# Patient Record
Sex: Male | Born: 1940 | Race: White | Hispanic: No | Marital: Married | Smoking: Current every day smoker
Health system: Southern US, Community
[De-identification: ages and names within clinical notes are randomized; demographics above are authoritative.]

## PROBLEM LIST (undated history)

## (undated) DIAGNOSIS — C439 Malignant melanoma of skin, unspecified: Secondary | ICD-10-CM

## (undated) DIAGNOSIS — I10 Essential (primary) hypertension: Secondary | ICD-10-CM

## (undated) DIAGNOSIS — M109 Gout, unspecified: Secondary | ICD-10-CM

## (undated) DIAGNOSIS — I509 Heart failure, unspecified: Secondary | ICD-10-CM

## (undated) HISTORY — DX: Gout, unspecified: M10.9

## (undated) HISTORY — PX: APPENDECTOMY: SHX54

## (undated) HISTORY — DX: Essential (primary) hypertension: I10

## (undated) HISTORY — DX: Heart failure, unspecified: I50.9

## (undated) HISTORY — DX: Malignant melanoma of skin, unspecified: C43.9

## (undated) HISTORY — PX: PARTIAL HIP ARTHROPLASTY: SHX733

---

## 2007-11-04 ENCOUNTER — Emergency Department: Payer: Self-pay | Admitting: Emergency Medicine

## 2008-12-11 ENCOUNTER — Ambulatory Visit: Payer: Self-pay | Admitting: General Surgery

## 2008-12-11 ENCOUNTER — Ambulatory Visit: Payer: Self-pay | Admitting: Cardiovascular Disease

## 2008-12-14 ENCOUNTER — Ambulatory Visit: Payer: Self-pay | Admitting: General Surgery

## 2009-01-15 ENCOUNTER — Emergency Department: Payer: Self-pay | Admitting: Emergency Medicine

## 2009-02-01 ENCOUNTER — Ambulatory Visit: Payer: Self-pay | Admitting: General Surgery

## 2009-11-29 ENCOUNTER — Emergency Department: Payer: Self-pay | Admitting: Emergency Medicine

## 2010-01-24 ENCOUNTER — Ambulatory Visit: Payer: Self-pay | Admitting: Surgery

## 2010-01-25 ENCOUNTER — Ambulatory Visit: Payer: Self-pay | Admitting: Surgery

## 2010-02-01 ENCOUNTER — Ambulatory Visit: Payer: Self-pay | Admitting: Surgery

## 2010-02-07 ENCOUNTER — Ambulatory Visit: Payer: Self-pay | Admitting: Surgery

## 2010-02-08 LAB — PATHOLOGY REPORT

## 2010-06-21 ENCOUNTER — Ambulatory Visit: Payer: Self-pay | Admitting: Cardiology

## 2013-02-11 ENCOUNTER — Ambulatory Visit: Payer: Self-pay | Admitting: Psychiatry

## 2013-02-24 ENCOUNTER — Ambulatory Visit: Payer: Self-pay | Admitting: Psychiatry

## 2014-01-30 ENCOUNTER — Ambulatory Visit: Payer: Self-pay | Admitting: Oncology

## 2014-01-30 LAB — CBC CANCER CENTER
BASOS PCT: 0.9 %
Basophil #: 0.1 x10 3/mm (ref 0.0–0.1)
EOS PCT: 3.2 %
Eosinophil #: 0.2 x10 3/mm (ref 0.0–0.7)
HCT: 28.9 % — ABNORMAL LOW (ref 40.0–52.0)
HGB: 9 g/dL — ABNORMAL LOW (ref 13.0–18.0)
LYMPHS ABS: 1.3 x10 3/mm (ref 1.0–3.6)
Lymphocyte %: 18.6 %
MCH: 27.7 pg (ref 26.0–34.0)
MCHC: 31.2 g/dL — ABNORMAL LOW (ref 32.0–36.0)
MCV: 89 fL (ref 80–100)
Monocyte #: 0.7 x10 3/mm (ref 0.2–1.0)
Monocyte %: 10.2 %
NEUTROS PCT: 67.1 %
Neutrophil #: 4.8 x10 3/mm (ref 1.4–6.5)
Platelet: 216 x10 3/mm (ref 150–440)
RBC: 3.25 10*6/uL — ABNORMAL LOW (ref 4.40–5.90)
RDW: 17.9 % — ABNORMAL HIGH (ref 11.5–14.5)
WBC: 7.1 x10 3/mm (ref 3.8–10.6)

## 2014-01-30 LAB — COMPREHENSIVE METABOLIC PANEL
ALT: 9 U/L — AB
ANION GAP: 7 (ref 7–16)
AST: 7 U/L — AB (ref 15–37)
Albumin: 2.6 g/dL — ABNORMAL LOW (ref 3.4–5.0)
Alkaline Phosphatase: 93 U/L
BUN: 14 mg/dL (ref 7–18)
Bilirubin,Total: 0.6 mg/dL (ref 0.2–1.0)
Calcium, Total: 8.2 mg/dL — ABNORMAL LOW (ref 8.5–10.1)
Chloride: 106 mmol/L (ref 98–107)
Co2: 30 mmol/L (ref 21–32)
Creatinine: 1.34 mg/dL — ABNORMAL HIGH (ref 0.60–1.30)
EGFR (Non-African Amer.): 56 — ABNORMAL LOW
Glucose: 100 mg/dL — ABNORMAL HIGH (ref 65–99)
OSMOLALITY: 286 (ref 275–301)
POTASSIUM: 3.9 mmol/L (ref 3.5–5.1)
Sodium: 143 mmol/L (ref 136–145)
TOTAL PROTEIN: 6.3 g/dL — AB (ref 6.4–8.2)

## 2014-01-30 LAB — TSH: THYROID STIMULATING HORM: 2.83 u[IU]/mL

## 2014-01-30 LAB — PROTIME-INR
INR: 1.2
Prothrombin Time: 15.3 secs — ABNORMAL HIGH (ref 11.5–14.7)

## 2014-01-30 LAB — IRON AND TIBC
IRON BIND. CAP.(TOTAL): 366 ug/dL (ref 250–450)
IRON: 24 ug/dL — AB (ref 65–175)
Iron Saturation: 7 %
Unbound Iron-Bind.Cap.: 342 ug/dL

## 2014-01-30 LAB — PRO B NATRIURETIC PEPTIDE: B-TYPE NATIURETIC PEPTID: 1654 pg/mL — AB (ref 0–125)

## 2014-01-30 LAB — FERRITIN: Ferritin (ARMC): 17 ng/mL (ref 8–388)

## 2014-01-30 LAB — URIC ACID: URIC ACID: 5.1 mg/dL (ref 3.5–7.2)

## 2014-01-30 LAB — HEMOGLOBIN A1C: HEMOGLOBIN A1C: 4.9 % (ref 4.2–6.3)

## 2014-01-30 LAB — LACTATE DEHYDROGENASE: LDH: 161 U/L (ref 85–241)

## 2014-01-30 LAB — APTT: ACTIVATED PTT: 33.7 s (ref 23.6–35.9)

## 2014-02-10 ENCOUNTER — Ambulatory Visit: Payer: Self-pay | Admitting: Oncology

## 2014-02-12 LAB — COMPREHENSIVE METABOLIC PANEL
ALT: 10 U/L — AB
Albumin: 3.2 g/dL — ABNORMAL LOW (ref 3.4–5.0)
Alkaline Phosphatase: 107 U/L
Anion Gap: 3 — ABNORMAL LOW (ref 7–16)
BILIRUBIN TOTAL: 0.5 mg/dL (ref 0.2–1.0)
BUN: 22 mg/dL — AB (ref 7–18)
CALCIUM: 8.5 mg/dL (ref 8.5–10.1)
CHLORIDE: 106 mmol/L (ref 98–107)
CO2: 31 mmol/L (ref 21–32)
Creatinine: 1.43 mg/dL — ABNORMAL HIGH (ref 0.60–1.30)
GFR CALC NON AF AMER: 52 — AB
GLUCOSE: 107 mg/dL — AB (ref 65–99)
Osmolality: 283 (ref 275–301)
Potassium: 3.6 mmol/L (ref 3.5–5.1)
SGOT(AST): 11 U/L — ABNORMAL LOW (ref 15–37)
Sodium: 140 mmol/L (ref 136–145)
TOTAL PROTEIN: 7.2 g/dL (ref 6.4–8.2)

## 2014-02-12 LAB — CBC CANCER CENTER
BASOS ABS: 0.1 x10 3/mm (ref 0.0–0.1)
Basophil %: 0.6 %
EOS PCT: 3.5 %
Eosinophil #: 0.3 x10 3/mm (ref 0.0–0.7)
HCT: 35.2 % — ABNORMAL LOW (ref 40.0–52.0)
HGB: 11.4 g/dL — AB (ref 13.0–18.0)
LYMPHS ABS: 2.1 x10 3/mm (ref 1.0–3.6)
Lymphocyte %: 21.6 %
MCH: 27.7 pg (ref 26.0–34.0)
MCHC: 32.4 g/dL (ref 32.0–36.0)
MCV: 86 fL (ref 80–100)
Monocyte #: 1 x10 3/mm (ref 0.2–1.0)
Monocyte %: 10 %
Neutrophil #: 6.2 x10 3/mm (ref 1.4–6.5)
Neutrophil %: 64.3 %
Platelet: 271 x10 3/mm (ref 150–440)
RBC: 4.11 10*6/uL — ABNORMAL LOW (ref 4.40–5.90)
RDW: 21.2 % — ABNORMAL HIGH (ref 11.5–14.5)
WBC: 9.7 x10 3/mm (ref 3.8–10.6)

## 2014-02-12 LAB — APTT: Activated PTT: 32.4 secs (ref 23.6–35.9)

## 2014-02-12 LAB — PROTIME-INR
INR: 1.1
PROTHROMBIN TIME: 14.2 s (ref 11.5–14.7)

## 2014-02-17 ENCOUNTER — Ambulatory Visit: Payer: Self-pay | Admitting: Cardiothoracic Surgery

## 2014-02-18 LAB — COMPREHENSIVE METABOLIC PANEL
ALT: 10 U/L — AB
Albumin: 3 g/dL — ABNORMAL LOW (ref 3.4–5.0)
Alkaline Phosphatase: 92 U/L
Anion Gap: 7 (ref 7–16)
BUN: 22 mg/dL — AB (ref 7–18)
Bilirubin,Total: 0.6 mg/dL (ref 0.2–1.0)
CREATININE: 1.09 mg/dL (ref 0.60–1.30)
Calcium, Total: 8.9 mg/dL (ref 8.5–10.1)
Chloride: 104 mmol/L (ref 98–107)
Co2: 31 mmol/L (ref 21–32)
EGFR (Non-African Amer.): >60
GLUCOSE: 106 mg/dL — AB (ref 65–99)
Osmolality: 287 (ref 275–301)
Potassium: 3.9 mmol/L (ref 3.5–5.1)
SGOT(AST): 10 U/L — ABNORMAL LOW (ref 15–37)
SODIUM: 142 mmol/L (ref 136–145)
Total Protein: 6.6 g/dL (ref 6.4–8.2)

## 2014-02-18 LAB — CBC CANCER CENTER
Basophil #: 0.1 x10 3/mm (ref 0.0–0.1)
Basophil %: 1.3 %
Eosinophil #: 0.1 x10 3/mm (ref 0.0–0.7)
Eosinophil %: 1.7 %
HCT: 34.1 % — ABNORMAL LOW (ref 40.0–52.0)
HGB: 10.7 g/dL — ABNORMAL LOW (ref 13.0–18.0)
Lymphocyte #: 1.7 x10 3/mm (ref 1.0–3.6)
Lymphocyte %: 22.1 %
MCH: 26.9 pg (ref 26.0–34.0)
MCHC: 31.4 g/dL — ABNORMAL LOW (ref 32.0–36.0)
MCV: 86 fL (ref 80–100)
Monocyte #: 0.7 x10 3/mm (ref 0.2–1.0)
Monocyte %: 9.1 %
Neutrophil #: 5.1 x10 3/mm (ref 1.4–6.5)
Neutrophil %: 65.8 %
Platelet: 210 x10 3/mm (ref 150–440)
RBC: 3.99 10*6/uL — ABNORMAL LOW (ref 4.40–5.90)
RDW: 21.1 % — ABNORMAL HIGH (ref 11.5–14.5)
WBC: 7.7 x10 3/mm (ref 3.8–10.6)

## 2014-02-18 LAB — PROTIME-INR
INR: 1.2
Prothrombin Time: 14.7 secs (ref 11.5–14.7)

## 2014-02-18 LAB — APTT: Activated PTT: 34.6 secs (ref 23.6–35.9)

## 2014-02-24 ENCOUNTER — Ambulatory Visit: Payer: Self-pay | Admitting: Oncology

## 2014-03-02 LAB — CBC CANCER CENTER
Basophil #: 0.1 x10 3/mm (ref 0.0–0.1)
Basophil %: 0.9 %
EOS PCT: 2.9 %
Eosinophil #: 0.2 x10 3/mm (ref 0.0–0.7)
HCT: 39 % — ABNORMAL LOW (ref 40.0–52.0)
HGB: 12.5 g/dL — AB (ref 13.0–18.0)
LYMPHS ABS: 1.7 x10 3/mm (ref 1.0–3.6)
Lymphocyte %: 23.2 %
MCH: 26.4 pg (ref 26.0–34.0)
MCHC: 31.9 g/dL — ABNORMAL LOW (ref 32.0–36.0)
MCV: 83 fL (ref 80–100)
MONO ABS: 0.6 x10 3/mm (ref 0.2–1.0)
Monocyte %: 8.4 %
NEUTROS PCT: 64.6 %
Neutrophil #: 4.8 x10 3/mm (ref 1.4–6.5)
PLATELETS: 251 x10 3/mm (ref 150–440)
RBC: 4.72 10*6/uL (ref 4.40–5.90)
RDW: 20.5 % — AB (ref 11.5–14.5)
WBC: 7.4 x10 3/mm (ref 3.8–10.6)

## 2014-03-02 LAB — COMPREHENSIVE METABOLIC PANEL
ALBUMIN: 3.3 g/dL — AB (ref 3.4–5.0)
ALT: 9 U/L — AB
ANION GAP: 8 (ref 7–16)
AST: 13 U/L — AB (ref 15–37)
Alkaline Phosphatase: 91 U/L
BILIRUBIN TOTAL: 0.8 mg/dL (ref 0.2–1.0)
BUN: 22 mg/dL — ABNORMAL HIGH (ref 7–18)
CALCIUM: 10.3 mg/dL — AB (ref 8.5–10.1)
CHLORIDE: 102 mmol/L (ref 98–107)
CREATININE: 1.24 mg/dL (ref 0.60–1.30)
Co2: 29 mmol/L (ref 21–32)
EGFR (African American): >60
GLUCOSE: 99 mg/dL (ref 65–99)
Osmolality: 281 (ref 275–301)
Potassium: 4.3 mmol/L (ref 3.5–5.1)
Sodium: 139 mmol/L (ref 136–145)
Total Protein: 7.4 g/dL (ref 6.4–8.2)

## 2014-03-02 LAB — TSH: THYROID STIMULATING HORM: 2.43 u[IU]/mL

## 2014-03-02 LAB — LACTATE DEHYDROGENASE: LDH: 105 U/L (ref 85–241)

## 2014-03-02 LAB — MAGNESIUM: MAGNESIUM: 1.5 mg/dL — AB

## 2014-03-02 LAB — T4, FREE: Free Thyroxine: 1.15 ng/dL (ref 0.76–1.46)

## 2014-03-02 LAB — PHOSPHORUS: Phosphorus: 4.3 mg/dL (ref 2.5–4.9)

## 2014-03-06 LAB — COMPREHENSIVE METABOLIC PANEL
ALBUMIN: 3 g/dL — AB (ref 3.4–5.0)
Alkaline Phosphatase: 92 U/L
Anion Gap: 11 (ref 7–16)
BILIRUBIN TOTAL: 0.7 mg/dL (ref 0.2–1.0)
BUN: 21 mg/dL — ABNORMAL HIGH (ref 7–18)
CHLORIDE: 106 mmol/L (ref 98–107)
CO2: 24 mmol/L (ref 21–32)
Calcium, Total: 7.6 mg/dL — ABNORMAL LOW (ref 8.5–10.1)
Creatinine: 1.29 mg/dL (ref 0.60–1.30)
EGFR (Non-African Amer.): 58 — ABNORMAL LOW
GLUCOSE: 115 mg/dL — AB (ref 65–99)
OSMOLALITY: 285 (ref 275–301)
POTASSIUM: 4 mmol/L (ref 3.5–5.1)
SGOT(AST): 14 U/L — ABNORMAL LOW (ref 15–37)
SGPT (ALT): 14 U/L
Sodium: 141 mmol/L (ref 136–145)
Total Protein: 6.9 g/dL (ref 6.4–8.2)

## 2014-03-06 LAB — CBC CANCER CENTER
BASOS ABS: 0 x10 3/mm (ref 0.0–0.1)
Basophil %: 0.8 %
Eosinophil #: 0.1 x10 3/mm (ref 0.0–0.7)
Eosinophil %: 2.6 %
HCT: 38.9 % — AB (ref 40.0–52.0)
HGB: 12 g/dL — AB (ref 13.0–18.0)
LYMPHS ABS: 0.4 x10 3/mm — AB (ref 1.0–3.6)
LYMPHS PCT: 17.3 %
MCH: 25.9 pg — AB (ref 26.0–34.0)
MCHC: 31 g/dL — AB (ref 32.0–36.0)
MCV: 84 fL (ref 80–100)
MONO ABS: 0.6 x10 3/mm (ref 0.2–1.0)
Monocyte %: 23.8 %
NEUTROS ABS: 1.4 x10 3/mm (ref 1.4–6.5)
Neutrophil %: 55.5 %
Platelet: 122 x10 3/mm — ABNORMAL LOW (ref 150–440)
RBC: 4.66 10*6/uL (ref 4.40–5.90)
RDW: 20.5 % — ABNORMAL HIGH (ref 11.5–14.5)
WBC: 2.4 x10 3/mm — ABNORMAL LOW (ref 3.8–10.6)

## 2014-03-09 LAB — CBC CANCER CENTER
BASOS ABS: 0.1 x10 3/mm (ref 0.0–0.1)
Basophil %: 3.2 %
Eosinophil #: 0.2 x10 3/mm (ref 0.0–0.7)
Eosinophil %: 4.5 %
HCT: 38.7 % — ABNORMAL LOW (ref 40.0–52.0)
HGB: 12.1 g/dL — ABNORMAL LOW (ref 13.0–18.0)
LYMPHS ABS: 1.2 x10 3/mm (ref 1.0–3.6)
Lymphocyte %: 33.1 %
MCH: 25.9 pg — ABNORMAL LOW (ref 26.0–34.0)
MCHC: 31.1 g/dL — ABNORMAL LOW (ref 32.0–36.0)
MCV: 83 fL (ref 80–100)
Monocyte #: 0.6 x10 3/mm (ref 0.2–1.0)
Monocyte %: 15.5 %
Neutrophil #: 1.6 x10 3/mm (ref 1.4–6.5)
Neutrophil %: 43.7 %
Platelet: 82 x10 3/mm — ABNORMAL LOW (ref 150–440)
RBC: 4.65 10*6/uL (ref 4.40–5.90)
RDW: 19.8 % — AB (ref 11.5–14.5)
WBC: 3.6 x10 3/mm — ABNORMAL LOW (ref 3.8–10.6)

## 2014-03-09 LAB — COMPREHENSIVE METABOLIC PANEL
ANION GAP: 6 — AB (ref 7–16)
AST: 15 U/L (ref 15–37)
Albumin: 3 g/dL — ABNORMAL LOW (ref 3.4–5.0)
Alkaline Phosphatase: 102 U/L
BILIRUBIN TOTAL: 0.9 mg/dL (ref 0.2–1.0)
BUN: 19 mg/dL — ABNORMAL HIGH (ref 7–18)
CALCIUM: 8.1 mg/dL — AB (ref 8.5–10.1)
Chloride: 107 mmol/L (ref 98–107)
Co2: 24 mmol/L (ref 21–32)
Creatinine: 1.07 mg/dL (ref 0.60–1.30)
EGFR (Non-African Amer.): >60
Glucose: 117 mg/dL — ABNORMAL HIGH (ref 65–99)
Osmolality: 277 (ref 275–301)
POTASSIUM: 3.9 mmol/L (ref 3.5–5.1)
SGPT (ALT): 15 U/L
Sodium: 137 mmol/L (ref 136–145)
Total Protein: 6.7 g/dL (ref 6.4–8.2)

## 2014-03-23 LAB — CBC CANCER CENTER
Basophil #: 0.1 x10 3/mm (ref 0.0–0.1)
Basophil %: 1.3 %
Eosinophil #: 0.4 x10 3/mm (ref 0.0–0.7)
Eosinophil %: 6.7 %
HCT: 39.7 % — AB (ref 40.0–52.0)
HGB: 12.5 g/dL — ABNORMAL LOW (ref 13.0–18.0)
LYMPHS ABS: 1.5 x10 3/mm (ref 1.0–3.6)
Lymphocyte %: 26.6 %
MCH: 25.9 pg — ABNORMAL LOW (ref 26.0–34.0)
MCHC: 31.5 g/dL — ABNORMAL LOW (ref 32.0–36.0)
MCV: 82 fL (ref 80–100)
MONOS PCT: 11.8 %
Monocyte #: 0.7 x10 3/mm (ref 0.2–1.0)
NEUTROS PCT: 53.6 %
Neutrophil #: 3.1 x10 3/mm (ref 1.4–6.5)
PLATELETS: 221 x10 3/mm (ref 150–440)
RBC: 4.84 10*6/uL (ref 4.40–5.90)
RDW: 21.7 % — AB (ref 11.5–14.5)
WBC: 5.8 x10 3/mm (ref 3.8–10.6)

## 2014-03-23 LAB — MAGNESIUM: MAGNESIUM: 1.6 mg/dL — AB

## 2014-03-23 LAB — COMPREHENSIVE METABOLIC PANEL
ANION GAP: 9 (ref 7–16)
AST: 10 U/L — AB (ref 15–37)
Albumin: 3.2 g/dL — ABNORMAL LOW (ref 3.4–5.0)
Alkaline Phosphatase: 89 U/L
BUN: 19 mg/dL — ABNORMAL HIGH (ref 7–18)
Bilirubin,Total: 0.5 mg/dL (ref 0.2–1.0)
CALCIUM: 8.2 mg/dL — AB (ref 8.5–10.1)
CO2: 27 mmol/L (ref 21–32)
Chloride: 107 mmol/L (ref 98–107)
Creatinine: 1.22 mg/dL (ref 0.60–1.30)
EGFR (African American): >60
EGFR (Non-African Amer.): >60
Glucose: 98 mg/dL (ref 65–99)
Osmolality: 287 (ref 275–301)
Potassium: 4.2 mmol/L (ref 3.5–5.1)
SGPT (ALT): 13 U/L — ABNORMAL LOW
Sodium: 143 mmol/L (ref 136–145)
Total Protein: 6.9 g/dL (ref 6.4–8.2)

## 2014-03-27 ENCOUNTER — Ambulatory Visit: Payer: Self-pay | Admitting: Oncology

## 2014-04-03 LAB — MAGNESIUM: Magnesium: 1.3 mg/dL — ABNORMAL LOW

## 2014-04-03 LAB — BASIC METABOLIC PANEL
ANION GAP: 10 (ref 7–16)
BUN: 16 mg/dL (ref 7–18)
CALCIUM: 7.9 mg/dL — AB (ref 8.5–10.1)
Chloride: 104 mmol/L (ref 98–107)
Co2: 28 mmol/L (ref 21–32)
Creatinine: 1.3 mg/dL (ref 0.60–1.30)
EGFR (African American): >60
GFR CALC NON AF AMER: 58 — AB
GLUCOSE: 109 mg/dL — AB (ref 65–99)
OSMOLALITY: 285 (ref 275–301)
Potassium: 3.7 mmol/L (ref 3.5–5.1)
Sodium: 142 mmol/L (ref 136–145)

## 2014-04-06 LAB — CLOSTRIDIUM DIFFICILE(ARMC)

## 2014-04-13 LAB — COMPREHENSIVE METABOLIC PANEL
ALBUMIN: 2.5 g/dL — AB (ref 3.4–5.0)
ALK PHOS: 68 U/L
ALT: 20 U/L
AST: 12 U/L — AB (ref 15–37)
Anion Gap: 9 (ref 7–16)
BILIRUBIN TOTAL: 0.5 mg/dL (ref 0.2–1.0)
BUN: 21 mg/dL — ABNORMAL HIGH (ref 7–18)
Calcium, Total: 9.7 mg/dL (ref 8.5–10.1)
Chloride: 103 mmol/L (ref 98–107)
Co2: 26 mmol/L (ref 21–32)
Creatinine: 1.25 mg/dL (ref 0.60–1.30)
EGFR (African American): >60
EGFR (Non-African Amer.): >60
GLUCOSE: 94 mg/dL (ref 65–99)
Osmolality: 278 (ref 275–301)
Potassium: 4.1 mmol/L (ref 3.5–5.1)
Sodium: 138 mmol/L (ref 136–145)
TOTAL PROTEIN: 6.3 g/dL — AB (ref 6.4–8.2)

## 2014-04-13 LAB — CBC CANCER CENTER
Bands: 5 %
Comment - H1-Com5: NORMAL
HCT: 43.2 % (ref 40.0–52.0)
HGB: 13.9 g/dL (ref 13.0–18.0)
Lymphocytes: 15 %
MCH: 25.5 pg — AB (ref 26.0–34.0)
MCHC: 32.3 g/dL (ref 32.0–36.0)
MCV: 79 fL — ABNORMAL LOW (ref 80–100)
Monocytes: 25 %
Platelet: 335 x10 3/mm (ref 150–440)
RBC: 5.47 10*6/uL (ref 4.40–5.90)
RDW: 21.3 % — AB (ref 11.5–14.5)
Segmented Neutrophils: 53 %
Variant Lymphocyte: 2 %
WBC: 11.5 x10 3/mm — ABNORMAL HIGH (ref 3.8–10.6)

## 2014-04-13 LAB — TSH: Thyroid Stimulating Horm: 1.59 u[IU]/mL

## 2014-04-13 LAB — T4, FREE: Free Thyroxine: 1.2 ng/dL (ref 0.76–1.46)

## 2014-04-13 LAB — MAGNESIUM: MAGNESIUM: 1.5 mg/dL — AB

## 2014-04-27 ENCOUNTER — Ambulatory Visit: Payer: Self-pay | Admitting: Oncology

## 2014-04-27 ENCOUNTER — Ambulatory Visit
Admit: 2014-04-27 | Disposition: A | Discharge status: Home / Self Care | Payer: Self-pay | Attending: Oncology | Admitting: Oncology

## 2014-05-04 LAB — COMPREHENSIVE METABOLIC PANEL
ALK PHOS: 65 U/L (ref 46–116)
ALT: 22 U/L (ref 14–63)
Albumin: 2.2 g/dL — ABNORMAL LOW (ref 3.4–5.0)
Anion Gap: 10 (ref 7–16)
BILIRUBIN TOTAL: 0.4 mg/dL (ref 0.2–1.0)
BUN: 19 mg/dL — AB (ref 7–18)
Calcium, Total: 8.3 mg/dL — ABNORMAL LOW (ref 8.5–10.1)
Chloride: 106 mmol/L (ref 98–107)
Co2: 25 mmol/L (ref 21–32)
Creatinine: 1.08 mg/dL (ref 0.60–1.30)
EGFR (African American): >60
EGFR (Non-African Amer.): >60
Glucose: 118 mg/dL — ABNORMAL HIGH (ref 65–99)
Osmolality: 285 (ref 275–301)
Potassium: 3.5 mmol/L (ref 3.5–5.1)
SGOT(AST): 9 U/L — ABNORMAL LOW (ref 15–37)
Sodium: 141 mmol/L (ref 136–145)
TOTAL PROTEIN: 5.5 g/dL — AB (ref 6.4–8.2)

## 2014-05-04 LAB — CBC CANCER CENTER
Basophil #: 0 x10 3/mm (ref 0.0–0.1)
Basophil %: 0.1 %
Eosinophil #: 0 x10 3/mm (ref 0.0–0.7)
Eosinophil %: 0.1 %
HCT: 41.1 % (ref 40.0–52.0)
HGB: 13.1 g/dL (ref 13.0–18.0)
LYMPHS ABS: 0.6 x10 3/mm — AB (ref 1.0–3.6)
Lymphocyte %: 11.2 %
MCH: 25.6 pg — ABNORMAL LOW (ref 26.0–34.0)
MCHC: 32 g/dL (ref 32.0–36.0)
MCV: 80 fL (ref 80–100)
MONOS PCT: 17 %
Monocyte #: 0.9 x10 3/mm (ref 0.2–1.0)
NEUTROS ABS: 3.7 x10 3/mm (ref 1.4–6.5)
Neutrophil %: 71.6 %
PLATELETS: 109 x10 3/mm — AB (ref 150–440)
RBC: 5.13 10*6/uL (ref 4.40–5.90)
RDW: 22.7 % — ABNORMAL HIGH (ref 11.5–14.5)
WBC: 5.2 x10 3/mm (ref 3.8–10.6)

## 2014-05-04 LAB — MAGNESIUM: MAGNESIUM: 1.8 mg/dL

## 2014-05-04 LAB — T4, FREE: Free Thyroxine: 1.03 ng/dL (ref 0.76–1.46)

## 2014-05-04 LAB — TSH: Thyroid Stimulating Horm: 0.92 u[IU]/mL

## 2014-05-06 ENCOUNTER — Emergency Department: Payer: Self-pay | Admitting: Emergency Medicine

## 2014-05-17 ENCOUNTER — Inpatient Hospital Stay: Payer: Self-pay | Admitting: Internal Medicine

## 2014-05-26 ENCOUNTER — Ambulatory Visit
Admit: 2014-05-26 | Disposition: A | Discharge status: Home / Self Care | Payer: Self-pay | Attending: Oncology | Admitting: Oncology

## 2014-05-31 ENCOUNTER — Inpatient Hospital Stay: Payer: Self-pay | Admitting: Internal Medicine

## 2014-06-03 ENCOUNTER — Ambulatory Visit
Admit: 2014-06-03 | Disposition: A | Discharge status: Home / Self Care | Payer: Self-pay | Attending: Oncology | Admitting: Oncology

## 2014-06-12 ENCOUNTER — Ambulatory Visit: Payer: Self-pay | Admitting: Family Medicine

## 2014-06-26 ENCOUNTER — Ambulatory Visit
Admit: 2014-06-26 | Disposition: A | Discharge status: Home / Self Care | Payer: Self-pay | Attending: Oncology | Admitting: Oncology
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
## 2014-07-20 LAB — CBC CANCER CENTER
BASOS ABS: 0.1 x10 3/mm (ref 0.0–0.1)
Basophil %: 1.1 %
EOS ABS: 0.2 x10 3/mm (ref 0.0–0.7)
Eosinophil %: 3 %
HCT: 39.2 % — ABNORMAL LOW (ref 40.0–52.0)
HGB: 13 g/dL (ref 13.0–18.0)
LYMPHS PCT: 33.4 %
Lymphocyte #: 1.7 x10 3/mm (ref 1.0–3.6)
MCH: 30.3 pg (ref 26.0–34.0)
MCHC: 33.2 g/dL (ref 32.0–36.0)
MCV: 92 fL (ref 80–100)
Monocyte #: 0.8 x10 3/mm (ref 0.2–1.0)
Monocyte %: 14.8 %
NEUTROS ABS: 2.5 x10 3/mm (ref 1.4–6.5)
Neutrophil %: 47.7 %
Platelet: 170 x10 3/mm (ref 150–440)
RBC: 4.29 10*6/uL — ABNORMAL LOW (ref 4.40–5.90)
RDW: 21.1 % — AB (ref 11.5–14.5)
WBC: 5.1 x10 3/mm (ref 3.8–10.6)

## 2014-07-20 LAB — COMPREHENSIVE METABOLIC PANEL
Albumin: 3.4 g/dL — ABNORMAL LOW
Alkaline Phosphatase: 62 U/L
Anion Gap: 7 (ref 7–16)
BILIRUBIN TOTAL: 1 mg/dL
BUN: 18 mg/dL
CALCIUM: 7.9 mg/dL — AB
CREATININE: 1.17 mg/dL
Chloride: 107 mmol/L
Co2: 28 mmol/L
EGFR (Non-African Amer.): >60
GLUCOSE: 105 mg/dL — AB
Potassium: 3.6 mmol/L
SGOT(AST): 14 U/L — ABNORMAL LOW
SGPT (ALT): 8 U/L — ABNORMAL LOW
SODIUM: 142 mmol/L
Total Protein: 6.5 g/dL

## 2014-07-20 LAB — TSH: Thyroid Stimulating Horm: 4.06 u[IU]/mL

## 2014-07-20 LAB — SURGICAL PATHOLOGY

## 2014-07-20 LAB — MAGNESIUM: MAGNESIUM: 1.1 mg/dL — AB

## 2014-07-26 NOTE — Consult Note (Signed)
PATIENT NAME:  Eddie Elliott, Eddie Elliott MR#:  [id number] DATE OF BIRTH:  1940/06/03  DATE OF CONSULTATION:  05/19/2014  REFERRING PHYSICIAN:   CONSULTING PHYSICIAN:  Lupita Dawn. Candace Cruise, MD  REASON FOR REFERRAL: Chronic diarrhea.   HISTORY OF PRESENT ILLNESS: The patient is a 74 year old white male who was diagnosed with metastatic melanoma to the spine. He ended up having radiation to his spine. He also received chemotherapy. His last chemotherapy was 5 days ago. He also has severe diarrhea, up to every 3 hours for the past 53 days. He was charting them all, up to 6 per day without adequate relief . He also tried prednisone taper with the highest dose being 100 mg daily by his oncologist. Even with this, he has had no relief of his diarrhea. Interestingly enough, even despite all of this diarrhea, he has no electrolyte imbalance or chronic diarrhea. He recalls having a normal colonoscopy 4-5 years ago in [place], where he lives most of the time. His other main complaint is just leg swelling. He normally takes Lasix at home, but he has been off of it. Fortunately, the stool test was negative for Clostridium difficile.   PAST MEDICAL HISTORY: He has metastatic melanoma with metastasis to the lungs and spine. He said he has had GI bleeding in the past from ulcer disease many years ago. Other history includes hypertension and gout.   PAST SURGICAL HISTORY: Includes replacement, excision of the melanoma.   FAMILY HISTORY: There is no family history.   REVIEW OF SYSTEMS: Have already been  reviewed on the admission. There are no changes.   ALLERGIES: He has no known drug allergies.   MEDICATIONS AT HOME: Include probiotic, prednisone taper, Lasix 20 mg daily, carvedilol 3 125 mg once a day, Lomotil  baby aspirin, hydrocodone, and extra other medicines.   PHYSICAL EXAMINATION:  GENERAL: The patient appears in no acute distress right now.  VITAL SIGNS: He is afebrile. Vital signs are stable. HEAD AND NECK: Within  normal limits.  CARDIAC: Regular rhythm and rate. LUNGS: Clear bilaterally.  ABDOMEN: Showed normoactive bowel sounds, soft. It is nontender. There is no hepatomegaly.  EXTREMITIES: Showed 3+ pitting edema bilaterally.  NEUROLOGIC: Nonfocal.  LABORATORY DATA: Despite chronic diarrhea, his electrolytes are completely normal. BNP is high at 4474, albumin is 1.1. The rest of the liver enzymes are normal. Albumin is low at 2.6. Troponin level is normal. White count is 15.0, hemoglobin 15.5. Blood cultures are negative so far. Stool tests are negative so far, including Clostridium difficile.   ASSESSMENT: This a patient with chronic diarrhea at least for the past 53 days. I suspect this is related to chemotherapy. It is unlikely to be infectious at this time, after so many days of diarrhea. Radiation directly to spine is unlikely to cause radiation enteritis.   RECOMMENDATIONS: I recommended to increase the Lomotil up to 8 tablets per day. Can continue the prednisone taper. If the Lomotil does not work, we can try paregoric to slow his bowel movements. We will also consider colonoscopy later as an outpatient to check for colitis, although I would expect some response to prednisone by now. Thank you for the referral. I will continue to follow the patient.   ____________________________ Lupita Dawn. Candace Cruise, MD pyo:mw D: 05/20/2014 11:21:30 ET T: 05/20/2014 12:05:43 ET JOB#: [id number]  cc: Lupita Dawn. Candace Cruise, MD, <Dictator> Lupita Dawn Cristobal Advani MD ELECTRONICALLY SIGNED 05/25/2014 7:54

## 2014-07-26 NOTE — Consult Note (Signed)
PATIENT NAME:  Eddie Elliott, Eddie Elliott MR#:  [id number] DATE OF BIRTH:  23-Jan-1941  DATE OF CONSULTATION:  05/18/2014  REFERRING PHYSICIAN:   CONSULTING PHYSICIAN:  Dwayne D. Callwood, MD  INDICATION: Left edema, diarrhea, atrial fibrillation, anasarca.  PRIMARY CARDIOLOGIST: Isaias Cowman, MD  PRIMARY CARE PHYSICIAN: Juluis Pitch, MD  ONCOLOGIST: Delight Hoh, MD  HISTORY OF PRESENT ILLNESS: The patient is a 74 year old white male with metastatic melanoma to the lung and spine, history of GI bleeding in the past. The patient complains of persistent GI diarrhea because of possible chemo. The patient has had diarrhea for which caused significant swelling and redness. The patient reports that since starting chemotherapy he has been struggling with multiple watery diarrhea, up to 6 a day. Stools often wake him up from sleep. The patient has not seen any blood or dark stools. Does not have any abdominal pain. Begun to notice swelling in his lower extremities. He has had rash, he thinks related to chemotherapy. He states his edematous legs are tender to touch. He has had pain deep in his ankles and his legs. With the persistent leg pain and swelling, he came to the Emergency Room for evaluation.   PAST MEDICAL HISTORY: Metastatic melanoma to the lungs and spine, GI bleeding, peptic ulcer disease, hypertension, atrial fibrillation, gout, leg edema, mild obesity.   SOCIAL HISTORY: Lives with his wife. Also lives in [place] part of the year. Former smoker. No alcohol consumption. No drug use.   PAST SURGICAL HISTORY: Right hip replacement, excision of melanoma. He has also had  EGD with cauterization for peptic ulcer disease.   FAMILY HISTORY: Essentially negative.   REVIEW OF SYSTEMS: Complains of fatigue, weakness, diarrhea, weight gain, leg swelling, mild shortness of breath, leg pain. Denies weight loss. No hemoptysis or hematemesis. No bright red blood per rectum. No vision change or  hearing change. Denies sputum production. Denies cough. No blackout spells or syncope. He has complained of generalized rash as well.   ALLERGIES: None.   MEDICATIONS: Tamsulosin 0.4 once a day, propafenone 30 mg he states once a day, prochlorperazine 10 mg every 6 hours as needed, probiotic formula once a day, prednisone taper, furosemide 20 mg a day, carvedilol 2.125 a day, atropine diphenoxylate 0.25/2.5 two tablets 3 times a day, aspirin 81 mg a day, Xanax 0.25 twice a day, Tylenol with Codeine 325/5 every 6 hours as needed.   PHYSICAL EXAMINATION: VITAL SIGNS: Blood pressure 110/70, pulse around 100 and irregular, respiratory rate 16, afebrile.  HEENT: Normocephalic, atraumatic. Pupils equal and reactive to light.  NECK: Supple. No significant JVD, bruits or adenopathy.  LUNGS: Clear to auscultation and percussion. No significant wheezing, rhonchi, or rale.  HEART: Irregular, irregular. Systolic ejection murmur at left sternal border. PMI nondisplaced.  ABDOMEN: Benign.  EXTREMITIES: Significant edema, 3 to 4+, with redness and tenderness. Adequate pulses.  NEUROLOGIC: Intact.  SKIN: Normal except for diffuse erythematous rash.   DIAGNOSTIC DATA: Sodium 140, potassium 4.4, chloride 104, bicarb 29, BUN 19, creatinine 0.98. BNP 4474. Glucose 176, calcium 7.5. LFTs normal. Troponin 0.02. White count 15, hemoglobin 15.5, platelet count 188,000. C. diff was negative.   CT of the abdomen and pelvis with contrast shows no acute findings. There is chronic metastatic disease in L5 vertebra.  Chest x-ray is negative.   ASSESSMENT:  1.  Diarrhea. 2.  Atrial fibrillation. 3.  Anasarca, leg swelling, edema, pain. 4.  Metastatic melanoma. 5.  Hypertension. 6.  Tachycardia. 7.  Obesity. 8.  History  of peptic ulcer disease.   PLAN:  1.  Agree with admit. Continue to control diarrhea. With negative Clostridium difficile, probably related to radiation and chemotherapy. Infectious process must  also still be considered. We will continue to Hemoccult stool. Agree with gastroenterology evaluation and work-up. Continue empiric antibiotic therapy for now. Consider scope and/or biopsy. Continue medications to help with symptoms.  2.  Atrial fibrillation. Continue ventricular fibrillation rate control. Do not recommend anticoagulation at this point. Continue Coreg therapy. Got diltiazem in the Emergency Room. Recommend echocardiogram. Continue rate control. Would probably hold off on anticoagulation for now. Continue antiarrhythmic control with propafenone. The dose should be increased, probably around 300 twice a day. 3.  Anasarca. Continue diuretic control. Would try to reduce volume overload. This may all be related to chemotherapy. Get 2-D echo for evaluation of systolic failure related to chemotherapy. Consider volume restriction and mild diuretic therapy.  4.  Pain control. Continue significant pain control for his legs and his back as well as metastatic disease as necessary.  5.  For swelling, ultrasound of his legs to rule out deep venous thrombosis and mild compression stockings.  6.  Deep vein thrombosis prophylaxis I think should be helpful with his increased risk because of his known cancer. Continue hypertension control. Continue gout management. 7.  For reflux and peptic ulcer disease, continue empiric therapy. No evidence of bleeding at this point. He is not on anticoagulation, except for possible deep vein thrombosis prophylaxis. Continue medical therapy. Agree with gastroenterology input. Continue to follow the patient.  ____________________________ Loran Senters. Clayborn Bigness, MD ddc:sb D: 05/19/2014 10:04:44 ET T: 05/19/2014 10:35:51 ET JOB#: [id number]  cc: Dwayne D. Clayborn Bigness, MD, <Dictator> Yolonda Kida MD ELECTRONICALLY SIGNED 06/14/2014 17:35

## 2014-07-26 NOTE — Consult Note (Signed)
Brief Consult Note: Comments: Attempted to see pt but pt was down for PET.  Discussed with Attending.  Will see pt later today.  Electronic Signatures: Andria Meuse (NP)  (Signed 07-Mar-16 10:28)  Authored: Brief Consult Note   Last Updated: 07-Mar-16 10:28 by Andria Meuse (NP)
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
## 2014-07-26 NOTE — Consult Note (Signed)
Present Illness The patient is a 74 year old male with a history of metastatic malignant melanoma and chronic diarrhea, history of cardiac atrial fibrillation on propafenone. The patient presents to the emergency room today with worsening diarrhea associated with weakness and fatigue. In the emergency room, the patient was noted to be short of breath and hypotensive.Laboratories revealed evidence of acute on chronic renal insufficiency.  He had had watery diarrhea for quite some time.  He was taking a diuretic at that time.  He was relatively tachycardic with his atrial fibrillation and hypotensive.  He was given IV fluids with improvement in his heart rate and blood pressure.  He currently is in atrial fibrillation with a ventricular response of approximately 90 beats per minute.  His blood pressure has improved.  His systolic blood pressures are in the low to mid 100 range.  He is ruled out for a myocardial infarction.  The his diarrhea improved somewhat today.  His serum potassium and serum magnesium are normal eyes.  He currently feels at his baseline.  He underwent a PET scan to evaluate possible progression of his disease.  Results and evaluation of this are still pending.  He has not been felt to be a candidate for chronic anticoagulation due to comorbid condition and bleeding risk.  He is currently on aspirin alone.  He remains on   Rythmol at 300 mg once daily and carvedilol for rate control.   Physical Exam:  GEN no acute distress   HEENT PERRL   NECK No masses   RESP clear BS   CARD Irregular rate and rhythm  No murmur   ABD positive tenderness   LYMPH negative neck   EXTR negative cyanosis/clubbing, negative edema   SKIN normal to palpation   NEURO cranial nerves intact, motor/sensory function intact   PSYCH A+O to time, place, person   Review of Systems:  Subjective/Chief Complaint mildly weak and fatigued   General: Fatigue  Weakness   Skin: No Complaints   ENT: No  Complaints   Eyes: No Complaints   Neck: No Complaints   Respiratory: Short of breath   Cardiovascular: No Complaints   Gastrointestinal: Diarrhea   Genitourinary: No Complaints   Vascular: No Complaints   Musculoskeletal: No Complaints   Neurologic: No Complaints   Hematologic: No Complaints   Endocrine: No Complaints   Psychiatric: No Complaints   Review of Systems: All other systems were reviewed and found to be negative   Medications/Allergies Reviewed Medications/Allergies reviewed   Family & Social History:  Family and Social History:  Family History Non-Contributory   Social History negative tobacco   EKG:  Interpretation atrial fibrillation with rapid  ventricular response initially the    No Known Allergies:    Impression 74 year old male with history of  chronic atrial fibrillation controlled with Rythmol at 300 mg daily and carvedilol.  Was admitted with profound diarrhea causing dehydration.  He was weak and fatigued.  He was noted to be volume depleted as evidenced by  acute on chronic renal insufficiency.  His heart rate improved as well as blood pressure with rehydration.  His diarrhea improved today.  He is hemodynamically stable at present with a ventricular response of approximately 90. He is not a candidate for chronic anticoagulation due to bleeding risk.  Would continue with Rythmol at 300 mg daily and carvedilol.  Patient is not appear to require any further cardiac workup during this hospitalization.  He is currently stable.  He had an appointment  with Dr. Clayborn Bigness tomorrow.  Will cancel this and make it 1 week later.   Plan 1. Continue with Rythmol at 300 mg daily and carvedilol 2. Continue with aspirin avoiding chronic anticoagulation 3. Continue hydration 4. Follow-up with Dr. Clayborn Bigness as an outpatient 5. No further cardiac workup indicated during this hospitalization.   Electronic Signatures: Teodoro Spray (MD)  (Signed 07-Mar-16  17:27)  Authored: General Aspect/Present Illness, History and Physical Exam, Review of System, Family & Social History, EKG , Allergies, Impression/Plan   Last Updated: 07-Mar-16 17:27 by Teodoro Spray (MD)

## 2014-07-26 NOTE — Consult Note (Signed)
Brief Consult Note: Diagnosis: chronic diarrhea.   Patient was seen by consultant.   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  Recent hospitalization with negative stools studies.  Pancreatic insufficiency, microscopic colitis, medication side effects, radiation proctitis & opportunisitc infections such as CMV remain in differential.  He had PET scan today & we discussed pursuing colonoscopy with biopsies pending PET findings.  He will think about this.  Plan: 1) Consider pancreatic enzyme supplementation 2) Continue supportive measures 3) Follow up PET scan Thanks for allowing [place] to participate in his care.  Please see full dictated note. #[id number].  Electronic Signatures: Andria Meuse (NP)  (Signed 08-Mar-16 00:03)  Authored: Brief Consult Note   Last Updated: 08-Mar-16 00:03 by Andria Meuse (NP)

## 2014-07-26 NOTE — Consult Note (Signed)
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 HPI:   Patient last received treatment with nivolumab on May 04, 2014. He continues to have persistent diarrhea despite treatment with prednisone. Patient states his diarrhea is only mildly improved. He presented with increased shortness of breath and was found to be in atrial fibrillation. He also has increased bilateral lower extremity edema that is painful to touch. He has no neurologic complaints. He denies any current chest pain, shortness of breath, cough, or hemoptysis. He denies any fevers or weight loss. He denies any nausea, vomiting, or constipation. He has no urinary complaints. Patient offers no further specific complaints today.   Review of Systems:  Performance Status (ECOG): 2  Pain ?: No complaints (0, none)  Emotional well-being: None  Review of Systems:   As per HPI. Otherwise, 10 point system review was negative.   Allergies:  No Known Allergies:   Smoking History: Smoking History 2 30 yr smoking history.  PFSH: Additional Past Medical and Surgical History: melanoma in situ of scalp, melanoma ??2 above left and right scapula, unclear depth her stage. hypertension, gout, appendectomy, right hip replacement.    Family history: Negative and noncontributory.    Social history: Former heavy tobacco use, none recently. Denies alcohol.   Home Medications: Medication Instructions Last Modified Date/Time  predniSONE 10 mg oral tablet 5 tab(s) orally 2 times a day x 4 days 4 tab(s) orally 2 times a day x 4 days 3 tab(s) orally 2 times a day x 4 days 2 tab(s) orally 2 times a day x 4 days 1 tab(s) orally 2 times a day x 4 days 21-Feb-16 09:27  Probiotic Formula - oral capsule 1 cap(s) orally once a day 21-Feb-16 09:27  tamsulosin 0.4 mg oral capsule 1 cap(s) orally once a day 30 minutes after same  meal. (Secotex) 21-Feb-16 09:27  propafenone 300 mg oral tablet 1 tab(s) orally once a day 21-Feb-16 09:27  Aspirin Enteric Coated 81 mg oral delayed release tablet 1 tab(s) orally once a day 21-Feb-16 09:27  atropine-diphenoxylate 0.025 mg-2.5 mg oral tablet 2 tab(s) orally 3 times a day 21-Feb-16 09:27  furosemide 20 mg oral tablet 1 tab(s) orally once a day as needed for swelling. 21-Feb-16 09:27  acetaminophen-HYDROcodone 325 mg-5 mg oral tablet 1 tab(s) orally every 6 hours as needed for pain. 21-Feb-16 09:27  ALPRAZolam 0.25 mg oral tablet 1 tab(s) orally 2 times a day as needed for anxiety/sleep. 21-Feb-16 09:27  prochlorperazine 10 mg oral tablet 1 tab(s) orally every 6 hours, As Needed - for Nausea, Vomiting 21-Feb-16 09:27  carvedilol 3.125 mg oral tablet 1 tab(s) orally once a day 21-Feb-16 09:27   Vital Signs:  :: vital signs stable, patient afebrile.   Physical Exam:  General: well developed, well nourished, and in no acute distress.  Mental Status: normal affect  Eyes: anicteric sclera  Respiratory: clear to auscultation bilaterally  Cardiovascular: regular rate and rhythm, no murmur, rub, or gallop  Gastrointestinal: soft, nondistended, nontender, no organomegaly.  normal active bowel sounds  Musculoskeletal: 2-3+ bilateral lower extremity edema.  Skin: No rash or petechiae noted  Neurological: alert, answering all questions appropriately.  Cranial nerves grossly intact   Laboratory Results: Routine Chem:  22-Feb-16 04:05   Glucose, Serum  131  BUN  20  Creatinine (comp) 1.06  Sodium, Serum 138  Potassium, Serum 4.0  Chloride, Serum 101  CO2, Serum  32  Calcium (Total), Serum  7.2  Anion Gap  5  Osmolality (calc) 280  eGFR (African American) >60  eGFR (Non-African American) >60 (eGFR values <40m/min/1.73 m2 may be an indication of chronic kidney disease (CKD). Calculated eGFR, using the MRDR Study equation, is useful in  patients with stable renal  function. The eGFR calculation will not be reliable in acutely ill patients when serum creatinine is changing rapidly. It is not useful in patients on dialysis. The eGFR calculation may not be applicable to patients at the low and high extremes of body sizes, pregnant women, and vegetarians.)  Routine Hem:  22-Feb-16 04:05   WBC (CBC) 8.0  RBC (CBC) 5.05  Hemoglobin (CBC) 13.0  Hematocrit (CBC) 40.8  Platelet Count (CBC)  134  MCV 81  MCH  25.8  MCHC  31.9  RDW  24.1  Neutrophil % 75.2  Lymphocyte % 9.7  Monocyte % 14.4  Eosinophil % 0.6  Basophil % 0.1  Neutrophil # 6.0  Lymphocyte #  0.8  Monocyte #  1.2  Eosinophil # 0.1  Basophil # 0.0 (Result(s) reported on 18 May 2014 at 05:05AM.)   Medical Imaging Results:   Review Medical Imaging   Portable Single View 17-May-2014 07:22:00: IMPRESSION:  No active disease.      Electronically Signed    By: TKerby MoorsM.D.    On: 05/17/2014 07:55         Verified By: TAngelita Ingles M.D., Abdomen and Pelvis With Contrast 17-May-2014 10:39:00: IMPRESSION:  1. No definite acute findings identified within the abdomen or  pelvis.  2. Mild prominence of the colonic wall with mucosal enhancement but  no significant colonic wall edema. No pneumatosis or evidence of  perforation identified.  3. Stable right adrenal gland myelolipoma  4. 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      Electronically Signed    By: TKerby MoorsM.D.    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 Plan:   1. Melanoma: Patient last received treatment with nivolumab 218mkg on May 04, 2014. Given his persistent diarrhea, we will consider discontinuing treatments. It is possible his diarrhea is immune mediated.Diarrhea: Possibly secondary to his treatments although his high-dose extended prednisone taper  should have helped resolve his symptoms. No evidence of infection. Appreciate GI input. Patient will likely need a colonoscopy in the near future.today. H/o GI bleed: Patient reports no further bleeding. [place] determine whether this was unusual site of metastatic disease.Atrial fibrillation: Cardiac echo results noted. Appreciate cardiology input.Peripheral edema: Likely multifactorial. Monitor. consult, will follow.  Advance Directive:  Advance Directive ([employer]   Advance Directive Information Given patient refused   Electronic Signatures: FiDelight HohMD)  (Signed [id number])  Authored: Note Type, CC/HPI, Review of Systems, ALLERGIES, Smoking Cessation, Patient Family Social History, HOME MEDICATIONS, Vital Signs, Physical Exam, Lab Results Review, Rad Results Review, Assessment and Plan, Advance Directive   Last Updated: 22-Feb-16 17:55 by FiDelight HohMD)

## 2014-07-26 NOTE — Consult Note (Signed)
Pt seen and examined. Full consult to follow. Pt with metastatic melanoma.S/P XRT to spine and chemotherapy. Last chemo 5 days ago. Severe diarrhea upto every 3 hrs for 53 days. Tried lomotil upto 6 per day. Tried prednisone upto 100mg  daily. No effects on diarreha. Interestingly, no evidence of electrolyte imbalance from chronic diarrhea. Had normal colon 4-5 yrs ago in [place]. C/O mainly of leg swelling. C.diff neg. Chronic diarrhea likely due to chemotherapy. Doubt focal radiation to spine would cause chronic diarrhea. Doubt infectious process. Can give lomotil upto 8 tabs per day. If lomotil does not work, then can try paregoric as well.  Give lasix to control edema. Would consider colonoscopy later as outpt to check for colitis, although should have responded to prednisone. thanks.   Electronic Signatures: Verdie Shire (MD) (Signed on 22-Feb-16 07:54)  Authored   Last Updated: 22-Feb-16 15:35 by Verdie Shire (MD)

## 2014-07-26 NOTE — Discharge Summary (Signed)
PATIENT NAME:  Eddie Elliott, Eddie Elliott MR#:  [id number] DATE OF BIRTH:  1940-10-07  DATE OF ADMISSION:  05/31/2014 DATE OF DISCHARGE:  06/01/2014  DISCHARGE DIAGNOSES:  1. Acute on chronic diarrhea.  2. Malignant melanoma.   CONSULTATIONS: Gastroenterology.  Cardiology, Javier Docker. Ubaldo Glassing, MD.  PROCEDURES: None.   DISCHARGE MEDICATIONS: Tamsulosin 0.4 mg 1 capsule p.o. 30 minutes after meals, propafenone 300 mg 1 tablet p.o. once daily, probiotic 1 capsule p.o. once daily, aspirin 81 mg once daily, alprazolam 0.25 mg 1 tablet p.o. 2 times a day as needed for anxiety, prochlorperazine 10 mg 1 tablet every 6 hours as needed for nausea and vomiting, acetaminophen with hydrocodone 325/5 one tablet p.o. every 6 hours for 5 days as needed for pain, furosemide 20 mg 1 tablet p.o. 2 times a day for 1 week and then take once daily; Klor-Con 10 mEq 1 tablet p.o. once daily with Lasix, Coreg 3.125 mg 1 tablet p.o. 2 times a day, atropine with diphenoxylate 0.025 mg/2.5 mg 1 tablet p.o. 4 times a day, Creon 24,000 units 1 capsule p.o. 3 times a day for 2 times with meals and once with snack as recommended by gastroenterology.   DIET: Regular diet. Regular consistency.  Follow up with primary care physician in 1 week, Dr. Candace Cruise from gastroenterology in 1 week, Dr. Grayland Ormond in 1 to 2 weeks as recommended, and cardiology, Dr. Ubaldo Glassing, in 2 to 4 weeks.   BRIEF HISTORY AND PHYSICAL AND HOSPITAL COURSE: The patient is a 74 year old male with a history of metastatic malignant melanoma and chronic diarrhea, who came into the ED with a chief complaint of worsening of his diarrhea and hypotension. The patient has been following up with Dr. Candace Cruise as an outpatient. Also has chronic history of atrial fibrillation and on propafenone. Please see the history and physical for details.  HOSPITAL COURSE BASED ON THE PROBLEM:  1. Acute on chronic diarrhea regarding which the patient was admitted to the hospitalist service. IV fluids were  provided. He was started on empiric p.o. vancomycin and IV steroids. Stool for Clostridium difficile toxin was done and turned out to be negative. Stool for occult blood was also negative. He was given Lomotil and, by the next day, his watery diarrhea was resolved and the patient started having semi-formed stool. He started feeling much better. He was evaluated by gastroenterology PA, Ms. Vickey Huger, and after discussing with Dr. Candace Cruise they have recommended the patient to take Creon with 3 meals and snacks. Also Lomotil was recommended to be continued., As the patient started feeling better, they have recommended to discharge the patient home from a GI standpoint and follow up with Dr. Candace Cruise as an outpatient. 2. Chronic atrial fibrillation. The patient was seen by Dr. Ubaldo Glassing. As the patient is not a good candidate for anticoagulation, Dr. Ubaldo Glassing has recommended the patient to continue his home medications. Rythmol 300 mg on a daily basis and Coreg. Also recommended to continue aspirin salicylate to avoid chronic anticoagulation. Recommended to follow up with Dr. Clayborn Bigness the next day.  3. Metastatic malignant melanoma. The patient is to follow up with his oncologist for continuation of treatment and surveillance as scheduled as an outpatient. PET scan was ordered, which did not reveal any acute changes.  4. Acute kidney injury, resolved with IV fluids. Dehydration is significantly improved.  5. Hypomagnesemia, repleted with magnesium supplements. Magnesium is 1.8 on March 7.   The patient was discharged home under satisfactory condition.   PHYSICAL EXAMINATION: VITAL  SIGNS: On March 7, temperature 97.6, pulse 95 to 101, respirations 18, blood pressure 103/66. Pulse oximetry 95 to 98% on room air.  GENERAL APPEARANCE: Not in acute distress. Moderately built and nourished.  HEENT: Normocephalic, atraumatic. Pupils are equally reacting to light and accommodation. No scleral icterus. No sinus tenderness. No  postnasal drip. Moist mucous membranes.  NECK: Supple. No JVD. No. No thyromegaly. Range of motion is intact. LUNGS: Clear to auscultation bilaterally. No accessory muscle use and no anterior chest wall tenderness on palpation.  CARDIAC: S1, S2 normal. Irregularly irregular.  GASTROINTESTINAL: Soft. Bowel sounds are positive in all 4 quadrants. Some discomfort is present but nontender. No mass. NEUROLOGIC: Awake, alert, oriented x3. Cranial nerves II through XII are grossly intact. Motor and sensory are intact. Reflexes are 2+.Marland Kitchen  EXTREMITIES: No edema. No cyanosis. No clubbing.  SKIN: Warm to touch. Normal turgor. No rashes.  PSYCHIATRIC: Normal mood and affect.  LABORATORY AND IMAGING STUDIES: PET scan for melanoma restaging performed on March 7 revealed interval response to therapy. Large prevascular mass within the chest has decreased in size and degree of FDG uptake. Additionally, there has been interval decrease in size in FDG uptake associated with the left upper lobe pulmonary nodule. No new or progressive disease is identified. Interval improvement in abnormal uptake associated with a destructive lesion involving the L5 vertebra. The patient's BMP, glucose 160. BUN and creatinine are back to normal. Sodium and potassium are normal. Chloride 117, CO2 of 19, GFR greater than 60. Anion gap 7. Serum osmolality 290. Calcium 7.6, magnesium 1.8. LFTs: Total protein 5.5, albumin 2.1, bilirubin total 0.5, alkaline phosphatase 51, AST 14 ALT 9. CBC is normal. Stool for C. difficile toxin negative. No Salmonella. No Campylobacter. Fecal occult blood test was negative. Urinalysis: Nitrites and leukocyte esterase are negative. Blood is negative. Glucose and bilirubin are negative.  Diagnosis and plan of care were discussed in detail with the patient. He verbalized understanding of the plan. All questions were answered.  TOTAL TIME SPENT ON THE DISCHARGE: 45 minutes.   ____________________________ Nicholes Mango, MD ag:jh D: 06/05/2014 19:37:24 ET T: 06/06/2014 07:26:05 ET JOB#: [id number]  cc: Nicholes Mango, MD, <Dictator> Youlanda Roys. Lovie Macadamia, MD Lupita Dawn. Candace Cruise, MD Javier Docker Ubaldo Glassing, MD Kathlene November. Grayland Ormond, MD  Nicholes Mango MD ELECTRONICALLY SIGNED 06/15/2014 22:44

## 2014-07-26 NOTE — Discharge Summary (Signed)
PATIENT NAME:  Eddie Elliott, Eddie Elliott MR#:  [id number] DATE OF BIRTH:  04/20/1940  DATE OF ADMISSION:  05/17/2014 DATE OF DISCHARGE:  05/19/2014  ADMITTING PHYSICIAN: Barnetta Chapel P. Volanda Napoleon, MD   DISCHARGING PHYSICIAN: Gladstone Lighter, MD   PRIMARY CARE PHYSICIAN: Youlanda Roys. Lovie Macadamia, MD  PRIMARY ONCOLOGIST: Kathlene November. Grayland Ormond, MD   PRIMARY CARDIOLOGIST: Isaias Cowman, MD  PRIMARY GASTROENTEROLOGIST: Lupita Dawn. Candace Cruise, MD  [place]:  1.  GI consultation by Dr. Candace Cruise.  2.  Palliative care consultation by Dr. Ermalinda Memos.  3.  Cardiology consultation by Dr. Clayborn Bigness.  4.  Oncology consultation by Dr. Grayland Ormond.   DISCHARGE DIAGNOSES:  1.  Right leg cellulitis.  2.  Chronic bilateral lower extremity edema and anasarca.  3.  Acute systolic congestive heart failure, ejection fraction of 40%.  4.  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  5.  Atrial flutter, not on anticoagulation due to history of gastrointestinal bleed.  6.  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  Flomax 0.4 mg p.o. daily.  2.  Propafenone 300 mg p.o. daily.  3.  Probiotic 1 capsule p.o. daily.  4.  Prednisone taper, 8 more days left, per GI.  5.  Aspirin 81 mg p.o. daily.  6.  Atropine diphenoxylate 0.025 mg/2.5 mg 2 tablets 3 times a day.  7.  Xanax 0.25 mg p.o. b.i.d. as needed for anxiety.  8.  Prochlorperazine 10 mg orally q. 6 hours p.r.n. for nausea and vomiting.  9.  Norco 5/325 mg 1 tablet q. 6 hours p.r.n. for pain.  10.  Lasix 20 mg p.o. b.i.d. for 1 week and then 20 mg p.o. daily.  11.  Coreg 3.125 mg p.o. b.i.d.  12.  Flagyl 500 mg q. 8 hours for 7 days.  13.  Cipro 500 mg 1 tablet q. 12 hours for 7 more days.  14.  Keflex 500 mg p.o. 3 times a day for 9 days.  15.  Klor-Con 10 mEq 1 tablet once a day with Lasix.   DISCHARGE DIET: Low-sodium diet.   DISCHARGE ACTIVITY: As tolerated.    FOLLOWUP  INSTRUCTIONS:  1.  PCP followup in 1 week.  2.  Cardiology followup in 2 weeks for atrial flutter and CHF.  3.  Compression stockings, at least knee-high.  4. GI followup with Dr. Candace Cruise in 2 weeks.  5.  Follow up with Dr. Grayland Ormond as prior scheduled.   LABORATORIES AND IMAGING STUDIES PRIOR TO DISCHARGE:  1.  WBC 8.0, hemoglobin 13.3, hematocrit 40.8, platelet count 134,000.  2.  Sodium 138, potassium 4.0, chloride 104, bicarbonate 26, BUN 20, creatinine 1.23, glucose 175, and calcium of 7.3.  3.  ALT 12, AST 13, alkaline phosphatase 72, total bilirubin 0.4, albumin of 1.8.  4.  Stool for ova and parasites are negative. Stool cultures are negative.  5.  Echo Doppler showing LV ejection fraction is 40%-45%, RV overload and mild mitral regurgitation noted.  6.  Stool for Clostridium difficile is negative as well.  7.  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  8.  Blood cultures are negative.  9.  Ultrasound Dopplers of bilateral lower extremities showing no evidence of any DVT.   BRIEF HOSPITAL COURSE: Eddie Elliott is a 74 year old male with past  medical history significant for metastatic melanoma with metastases to lungs and spine, history of life-threatening GI bleed while he was traveling in [place] in late part of 2015, hypertension, gout, who presents to the hospital secondary to anasarca.  1.  Anasarca secondary to hypoalbuminemia and acute CHF exacerbation systolic dysfunction. He was seen by cardiology. Echocardiogram was done, results as noted above with EF of 40%. He was diuresed with Lasix with much improvement in his leg swelling and also breathing. He is being discharged on Lasix dose twice a day for 1 more week and then once a day after. He also noted to have right lower extremity cellulitis along  with anasarca, which is being treated with Keflex with significant improvement. Dopplers did not show any DVTs.  2.  Chronic diarrhea since starting on chemotherapy for almost 8 weeks now. Has been following with GI as an outpatient. They have started him on a prolonged prednisone taper, over 20 days, for immune-mediated diarrhea caused by chemotherapy. He is almost at the end of the taper. His stool cultures are negative. He was supposed to have colonoscopy as an outpatient, which he will follow up with Dr. Candace Cruise. Started on Cipro and Flagyl with improvement in his diarrhea in the hospital.  3.  Metastatic malignant melanoma. Will follow up with Dr. Grayland Ormond as an outpatient. His chemotherapy is  currently on pause secondary to the side effects done including diarrhea.  4.  Atrial flutter in the hospital, not on any anticoagulation to GI bleed. He is on propafenone. He is on aspirin and rate controlled with Coreg at this time.   His course has been otherwise uneventful in the hospital.   DISCHARGE CONDITION: Stable.   DISCHARGE DISPOSITION: Home.   TIME SPENT ON DISCHARGE: 40 minutes.    ____________________________ Gladstone Lighter, MD rk:bm D: 05/21/2014 16:35:21 ET T: 05/22/2014 04:19:29 ET JOB#: [id number]  cc: Gladstone Lighter, MD, <Dictator> Kathlene November. Grayland Ormond, MD Lupita Dawn. Candace Cruise, MD Youlanda Roys. Lovie Macadamia, MD Isaias Cowman, MD Gladstone Lighter MD ELECTRONICALLY SIGNED 06/11/2014 17:43

## 2014-07-26 NOTE — H&P (Signed)
PATIENT NAME:  Eddie Elliott, Eddie Elliott MR#:  [id number] DATE OF BIRTH:  May 12, 1940  DATE OF ADMISSION:  05/17/2014  PRIMARY CARE PHYSICIAN: Youlanda Roys. Lovie Macadamia, MD  PRIMARY ONCOLOGIST: Kathlene November. Grayland Ormond, MD  PRIMARY CARDIOLOGIST: Isaias Cowman, MD  PRIMARY GASTROENTEROLOGIST: Lupita Dawn. Candace Cruise, MD  REFERRING EMERGENCY ROOM PHYSICIAN: Larae Grooms, MD   CHIEF COMPLAINT: Diarrhea, swelling all over, and pain in the feet.   HISTORY OF PRESENT ILLNESS: This very pleasant 74 year old man with metastatic melanoma with metastases to the lungs and spine, history of recent GI bleed in [place] in late 2015, hypertension, gout, presents to the Emergency Room with complaint of chronic diarrhea which has been ongoing for 53 days and full-body swelling with extreme pain in the lower extremities. He reports that since starting chemotherapy he has been struggling with multiple watery stools every day. Stools often wake him from sleep and are uncontrollable. He has not seen any blood or dark tarry stools. He does not have pain in the abdomen. He has also begun to swell, particularly in the lower extremities. He has a full body rash which he attributes to chemotherapy, but both lower extremities are more erythematous than the rest of the body and are very tender to touch. He states that he has pain deep in the ankles as well as on the skin over both legs. On presentation to the Emergency Room, he is also found to be in atrial fibrillation with a rapid ventricular rate.   PAST MEDICAL HISTORY: 1.  Metastatic melanoma with metastases in the lungs and spine. 2.  History of GI bleed due to peptic ulcer disease while in [place], EGD report not accessible.  3.  Hypertension.  4.  Gout.   SOCIAL HISTORY: The patient lives with his wife. 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 He is a former smoker with greater than 30 years history. He does not drink alcohol.  Does not use any illicit substances.   PAST SURGICAL HISTORY:  1.  Right hip replacement.  2.  Excision of melanoma on the scalp and back.   FAMILY MEDICAL HISTORY: No family medical history.   REVIEW OF SYSTEMS: CONSTITUTIONAL: Positive for fatigue, weakness, increase in weight and pain.  HEENT: No pain in eyes or ears. No change in hearing or vision. No sore throat or difficulty swallowing.  RESPIRATORY: Positive for shortness of breath with exertion. No coughing, wheezing, hemoptysis, or sputum production.  CARDIOVASCULAR: No chest pain, palpitations. Positive for edema. No orthopnea.  GASTROINTESTINAL: Positive as noted for diarrhea. No nausea or vomiting. No abdominal pain. No hematemesis or hematochezia. No melena.  MUSCULOSKELETAL: He does have new pain in the legs, hips, and back. NEUROLOGIC: No headache, seizures, CVA, memory loss, focal numbness or weakness.  PSYCHIATRIC: Positive for anxiety. No schizophrenia or bipolar disorder.  GENITOURINARY: No dysuria or frequency.  HEMATOLOGIC: No easy bruising or bleeding.  SKIN: Positive for new rash attributed to chemotherapy all over the body. Positive for melanoma.   ALLERGIES: NO KNOWN ALLERGIES.   HOME MEDICATIONS:  1.  Tamsulosin 0.4 mg 1 capsule once a day.  2.  Propafenone 30 mg 1 tablet once a day.  3.  Prochlorperazine 10 mg, 1 tablet every 6 hours as needed for nausea and vomiting.  4.  Probiotic Formula 1 capsule daily.  5.  Prednisone 10 mg, 5 tablets 2 times a day x 4 days; decrease by 1 tablet every  4 days.  6.  Furosemide 20 mg 1 tablet once a day.  7.  Carvedilol 3.125 mg 1 tablet once a day.  8.  Atropine and diphenoxylate 0.25/2.5 mg 2 tablets orally 3 times a day.  9.  Aspirin 81 mg daily.  10.  Alprazolam 0.25 mg 1 tablet twice a day as needed for anxiety. 11.  Acetaminophen and hydrocodone 325/5 mg 1 tablet every 6 hours as needed for pain.   PHYSICAL EXAMINATION:  VITAL SIGNS: Temperature 98.8, pulse 100  at rest up to 160 with exertion, respirations 18, blood pressure 110/78, oxygenation 98% on room air.  GENERAL: The patient is very uncomfortable and agitated.  HEENT: Pupils equal, round, and reactive to light. Conjunctivae are clear. Extraocular motion intact. Oral mucous membranes pink and moist. Posterior oropharynx is clear with no exudate, edema, or erythema. Good dentition.  NECK: Supple. Trachea midline. Thyroid nontender.  RESPIRATORY: He has bibasilar crackles. Good air movement. No wheezing or rhonchi. No respiratory distress.  CARDIOVASCULAR: Tachycardic. Irregular. No murmurs, rubs, or gallops. There is 2 to 3+ pitting edema above the knees and also on the abdomen. Peripheral pulses 1+.  ABDOMEN: Soft, nontender, nondistended. There is some edema over the lower abdomen. Bowel sounds are normal. No guarding, no rebound, no hepatosplenomegaly.  EXTREMITIES: No swollen tender joints. Range of motion normal. Strength 5/5 throughout. Significant pain due to edema.  SKIN: There is a dry papular rash over the entire body, sparing the face and palms and soles. No blistering, no new wounds. There does seem to be significant erythema over the lower extremities; possible cellulitis. No drainage.  NEUROLOGIC: Cranial nerves II through XII grossly intact. Strength and sensation intact. Nonfocal.  PSYCHIATRIC: The patient is alert and oriented x 4. He is anxious and agitated.   LABORATORY DATA: Sodium 140, potassium 4.4, chloride 104, bicarbonate 29, BUN 19, creatinine 0.98. BNP is 4474. Glucose 176, calcium 7.5, total protein 6.6, albumin 2.6, bilirubin 1.1. Other LFTs normal. Troponin 0.02. White blood cells 15.0, hemoglobin 15.5, platelets 188,000, MCV 84. Clostridium difficile negative.  IMAGING: CT of the abdomen and pelvis with contrast shows no definite acute findings identified within the abdomen or pelvis. There is a mild prominence of the colonic wall with mucosal enhancement, but no  significant colonic wall edema. No pneumatosis or evidence of perforation. Stable right adrenal gland myolipoma. Chronic metastasis in the L5 vertebrae.  Chest x-ray showing no active disease.   ASSESSMENT AND PLAN:  1.  Diarrhea: Diarrhea has been ongoing for 53 days, according to the patient. Clostridium difficile is negative. Etiology seems to be radiation versus chemotherapy versus infectious cause. Stool cultures are pending. I will order ova and parasites. This is an immunocompromised patient that may warrant further infectious workup. He also has a history of recent gastrointestinal bleed. We will Hemoccult his stools, though I do not think the diarrhea is due to bleeding. Dr. Grayland Ormond had set him up to see Dr. Candace Cruise tomorrow for further evaluation of the diarrhea and followup on the gastrointestinal bleed. We will get a gastroenterology consult while he is inpatient. We will go ahead and start Cipro and Flagyl, as there is some colonic wall thickening on CT scan. Check blood cultures.  2.  Atrial fibrillation with rapid ventricular rate: He does have a history of atrial fibrillation though he is not on anticoagulation as an outpatient. I am not sure if this is chronic or paroxysmal. He has received 15 mg of carvedilol in the Emergency  Room which worked to control his heart rate temporarily. Heart rates are now escalating again and I will give another 10. We will get a 2-D echocardiogram. He seems significantly edematous and deconditioned. We will monitor him on telemetry. Cycle cardiac enzymes. We will get a cardiology consult; Dr. Saralyn Pilar is his primary cardiologist. I will continue his propafenone and increase carvedilol at this time to improve heart rate control.  3.  Anasarca: His Lasix had been held due to concern of volume depletion with chronic diarrhea. I will resume Lasix. He has already received 40 mg intravenously in the Emergency Department. He will be on a low-sodium diet. He does have a  very low serum albumin which predisposes him to third spacing. Check a 2-D echocardiogram. Elevate legs and encourage ambulation as tolerated. His renal function is excellent, though his blood pressure is slightly soft. Continue with Lasix 20 mg daily and additional intravenous doses as blood pressure will allow.  4.  Pain: I suspect that the pain he is experiencing is due to the stretching of the skin with anasarca. He has been seeing palliative care in the outpatient setting and I will consult them to follow here. He will continue on his home pain medication of Percocet as well as intravenous morphine as needed.  5.  Metastatic melanoma: We will consult oncology to follow.  6.  Hypertension: The patient is slightly hypotensive at this time. Will monitor carefully, as we are increasing his carvedilol and furosemide. 7.  Prophylaxis: Heparin for deep vein thrombosis prophylaxis. Protonix for gastrointestinal prophylaxis, especially given history of gastrointestinal bleeding in the past.  TIME SPENT ON ADMISSION: 45 minutes.   ____________________________ Earleen Newport. Volanda Napoleon, MD cpw:ST D: 05/17/2014 15:32:34 ET T: 05/17/2014 17:07:36 ET JOB#: [id number]  cc: Barnetta Chapel P. Volanda Napoleon, MD, <Dictator> Aldean Jewett MD ELECTRONICALLY SIGNED 05/19/2014 8:02

## 2014-08-30 ENCOUNTER — Other Ambulatory Visit: Payer: Self-pay | Admitting: Oncology

## 2014-08-30 DIAGNOSIS — C439 Malignant melanoma of skin, unspecified: Secondary | ICD-10-CM | POA: Insufficient documentation

## 2014-08-31 ENCOUNTER — Encounter (INDEPENDENT_AMBULATORY_CARE_PROVIDER_SITE_OTHER): Payer: Self-pay

## 2014-08-31 ENCOUNTER — Other Ambulatory Visit: Payer: Self-pay

## 2014-08-31 ENCOUNTER — Inpatient Hospital Stay: Payer: Medicare Other

## 2014-08-31 ENCOUNTER — Inpatient Hospital Stay: Payer: Medicare Other | Attending: Oncology | Admitting: Oncology

## 2014-08-31 ENCOUNTER — Encounter: Payer: Self-pay | Admitting: Oncology

## 2014-08-31 VITALS — BP 104/66 | HR 65 | Temp 98.3°F | Resp 20 | Wt 221.6 lb

## 2014-08-31 DIAGNOSIS — Z923 Personal history of irradiation: Secondary | ICD-10-CM | POA: Diagnosis not present

## 2014-08-31 DIAGNOSIS — D649 Anemia, unspecified: Secondary | ICD-10-CM | POA: Insufficient documentation

## 2014-08-31 DIAGNOSIS — Z79899 Other long term (current) drug therapy: Secondary | ICD-10-CM | POA: Diagnosis not present

## 2014-08-31 DIAGNOSIS — C439 Malignant melanoma of skin, unspecified: Secondary | ICD-10-CM | POA: Diagnosis not present

## 2014-08-31 DIAGNOSIS — F17210 Nicotine dependence, cigarettes, uncomplicated: Secondary | ICD-10-CM | POA: Diagnosis not present

## 2014-08-31 DIAGNOSIS — C7800 Secondary malignant neoplasm of unspecified lung: Secondary | ICD-10-CM | POA: Diagnosis not present

## 2014-08-31 DIAGNOSIS — Z87891 Personal history of nicotine dependence: Secondary | ICD-10-CM

## 2014-08-31 DIAGNOSIS — C7951 Secondary malignant neoplasm of bone: Secondary | ICD-10-CM | POA: Diagnosis not present

## 2014-08-31 DIAGNOSIS — Z7982 Long term (current) use of aspirin: Secondary | ICD-10-CM | POA: Diagnosis not present

## 2014-08-31 DIAGNOSIS — Z8639 Personal history of other endocrine, nutritional and metabolic disease: Secondary | ICD-10-CM | POA: Insufficient documentation

## 2014-08-31 DIAGNOSIS — Z5111 Encounter for antineoplastic chemotherapy: Secondary | ICD-10-CM | POA: Insufficient documentation

## 2014-08-31 DIAGNOSIS — I10 Essential (primary) hypertension: Secondary | ICD-10-CM | POA: Diagnosis not present

## 2014-08-31 DIAGNOSIS — Z7952 Long term (current) use of systemic steroids: Secondary | ICD-10-CM | POA: Diagnosis not present

## 2014-08-31 DIAGNOSIS — R609 Edema, unspecified: Secondary | ICD-10-CM | POA: Insufficient documentation

## 2014-08-31 LAB — CBC WITH DIFFERENTIAL/PLATELET
Basophils Absolute: 0 10*3/uL (ref 0–0.1)
Basophils Relative: 1 %
Eosinophils Absolute: 0.1 10*3/uL (ref 0–0.7)
Eosinophils Relative: 3 %
HCT: 41.9 % (ref 40.0–52.0)
Hemoglobin: 13.4 g/dL (ref 13.0–18.0)
Lymphocytes Relative: 19 %
Lymphs Abs: 0.8 10*3/uL — ABNORMAL LOW (ref 1.0–3.6)
MCH: 30.4 pg (ref 26.0–34.0)
MCHC: 32.1 g/dL (ref 32.0–36.0)
MCV: 94.8 fL (ref 80.0–100.0)
Monocytes Absolute: 0.7 10*3/uL (ref 0.2–1.0)
Monocytes Relative: 16 %
Neutro Abs: 2.4 10*3/uL (ref 1.4–6.5)
Neutrophils Relative %: 61 %
Platelets: 91 10*3/uL — ABNORMAL LOW (ref 150–440)
RBC: 4.43 MIL/uL (ref 4.40–5.90)
RDW: 16 % — ABNORMAL HIGH (ref 11.5–14.5)
WBC: 4 10*3/uL (ref 3.8–10.6)

## 2014-08-31 LAB — COMPREHENSIVE METABOLIC PANEL
ALT: 11 U/L — ABNORMAL LOW (ref 17–63)
AST: 18 U/L (ref 15–41)
Albumin: 3.2 g/dL — ABNORMAL LOW (ref 3.5–5.0)
Alkaline Phosphatase: 57 U/L (ref 38–126)
Anion gap: 3 — ABNORMAL LOW (ref 5–15)
BUN: 17 mg/dL (ref 6–20)
CO2: 28 mmol/L (ref 22–32)
Calcium: 9.4 mg/dL (ref 8.9–10.3)
Chloride: 107 mmol/L (ref 101–111)
Creatinine, Ser: 1.15 mg/dL (ref 0.61–1.24)
GFR calc Af Amer: >60 mL/min (ref >60)
GFR calc non Af Amer: >60 mL/min (ref >60)
Glucose, Bld: 91 mg/dL (ref 65–99)
Potassium: 3.9 mmol/L (ref 3.5–5.1)
Sodium: 138 mmol/L (ref 135–145)
Total Bilirubin: 0.8 mg/dL (ref 0.3–1.2)
Total Protein: 6 g/dL — ABNORMAL LOW (ref 6.5–8.1)

## 2014-08-31 MED ORDER — PROCHLORPERAZINE MALEATE 10 MG PO TABS: 10.0000 mg | ORAL_TABLET | Freq: Four times a day (QID) | ORAL | Status: DC | PRN

## 2014-08-31 MED ORDER — SODIUM CHLORIDE 0.9 % IV SOLN
300.0000 mg | Freq: Once | INTRAVENOUS | Status: AC
  Administered 2014-08-31: 300 mg via INTRAVENOUS
  Filled 2014-08-31: qty 30

## 2014-08-31 MED ORDER — FUROSEMIDE 20 MG PO TABS: 20.0000 mg | ORAL_TABLET | Freq: Every day | ORAL | Status: DC

## 2014-08-31 MED ORDER — LIDOCAINE-PRILOCAINE 2.5-2.5 % EX CREA: TOPICAL_CREAM | CUTANEOUS | Status: DC

## 2014-08-31 MED ORDER — HYDROCODONE-ACETAMINOPHEN 5-325 MG PO TABS
1.0000 | ORAL_TABLET | Freq: Four times a day (QID) | ORAL | Status: DC | PRN
Start: 2014-08-31 — End: 2014-12-30

## 2014-08-31 MED ORDER — SODIUM CHLORIDE 0.9 % IV SOLN
Freq: Once | INTRAVENOUS | Status: AC
  Administered 2014-08-31: 11:00:00 via INTRAVENOUS
  Filled 2014-08-31: qty 250

## 2014-08-31 MED ORDER — HEPARIN SOD (PORK) LOCK FLUSH 100 UNIT/ML IV SOLN: 500.0000 [IU] | Freq: Once | INTRAVENOUS | Status: DC | PRN

## 2014-09-01 ENCOUNTER — Telehealth: Payer: Self-pay

## 2014-09-01 ENCOUNTER — Other Ambulatory Visit: Payer: Self-pay

## 2014-09-01 MED ORDER — ALPRAZOLAM 0.25 MG PO TABS: 0.2500 mg | ORAL_TABLET | Freq: Two times a day (BID) | ORAL | Status: DC | PRN

## 2014-09-01 NOTE — Telephone Encounter (Signed)
rx for Alprazolam faxed to pharmacy.Eddie Elliott

## 2014-09-01 NOTE — Telephone Encounter (Signed)
sure

## 2014-09-01 NOTE — Telephone Encounter (Signed)
Patient wants to know if you will fill his Alprazolom 0.25 (does not know instructions because the bottle is in [place]).

## 2014-09-08 ENCOUNTER — Telehealth: Payer: Self-pay | Admitting: *Deleted

## 2014-09-08 DIAGNOSIS — R197 Diarrhea, unspecified: Secondary | ICD-10-CM

## 2014-09-08 DIAGNOSIS — Z5189 Encounter for other specified aftercare: Secondary | ICD-10-CM

## 2014-09-08 MED ORDER — PREDNISONE 50 MG PO TABS: 50.0000 mg | ORAL_TABLET | Freq: Every day | ORAL | Status: DC

## 2014-09-08 NOTE — Telephone Encounter (Signed)
Pt informed that Rx was sent to pharmacy.

## 2014-09-08 NOTE — Progress Notes (Signed)
[place]  Telephone:(336) [phone] Fax:(336) [phone]  ID: Bebe Liter OB: 1940/09/02  MR#: [id number]  TGG#:[id number]  Patient Care Team: Juluis Pitch, MD as PCP - General (Family Medicine)  CHIEF COMPLAINT:  Chief Complaint  Patient presents with  . 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 He recently returned from [place]. His diarrhea has resolved. He continues to have lower extremity edema.  He has no neurologic complaints. He denies any further GI bleeding. He denies any chest pain, shortness of breath, cough, or hemoptysis. He denies any fevers or weight loss. He denies any nausea, vomiting, or constipation. He has no urinary complaints. Patient offers no further specific complaints today.   REVIEW OF SYSTEMS:   Review of Systems  Constitutional: Negative for malaise/fatigue.  Respiratory: Negative.   Cardiovascular: Positive for leg swelling.  Gastrointestinal: Negative for diarrhea.  Neurological: Negative for weakness.    As per HPI. Otherwise, a complete review of systems is negatve.  PAST MEDICAL HISTORY: Past Medical History  Diagnosis Date  . 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 Hypertension   . Gout     PAST SURGICAL HISTORY: Past Surgical History  Procedure Laterality Date  . Appendectomy    . Partial hip arthroplasty Right     FAMILY HISTORY: Reviewed and unchanged. No reported history of malignancy or chronic disease.     ADVANCED DIRECTIVES:    HEALTH MAINTENANCE: History  Substance Use Topics  . Smoking status: Former Smoker    Types: Cigarettes  . Smokeless tobacco: Never Used  . Alcohol Use: No     Colonoscopy:  PAP:  Bone density:  Lipid panel:  Allergies  Allergen  Reactions  . No Known Allergies     Current Outpatient Prescriptions  Medication Sig Dispense Refill  . aspirin EC 81 MG tablet Take by mouth.    . carvedilol (COREG) 3.125 MG tablet Take by mouth.    . propafenone (RYTHMOL) 300 MG tablet Take 300 mg by mouth 2 (two) times daily.  0  . tamsulosin (FLOMAX) 0.4 MG CAPS capsule TAKE ONE CAPSULE BY MOUTH ONCE A DAY 30 MINUTES AFTER SAME MEAL EACH DAY    . ALPRAZolam (XANAX) 0.25 MG tablet Take 1 tablet (0.25 mg total) by mouth 2 (two) times daily as needed for anxiety. 45 tablet 0  . furosemide (LASIX) 20 MG tablet Take 1 tablet (20 mg total) by mouth daily. TAKE 1 TABLET (20 MG TOTAL) BY MOUTH ONCE a day to twice a day, PRN. 60 tablet 1  . HYDROcodone-acetaminophen (NORCO/VICODIN) 5-325 MG per tablet Take 1 tablet by mouth every 6 (six) hours as needed for moderate pain. 120 tablet 0  . prochlorperazine (COMPAZINE) 10 MG tablet Take 1 tablet (10 mg total) by mouth every 6 (six) hours as needed for nausea or vomiting. 120 tablet 1   No current facility-administered medications for this visit.    OBJECTIVE: Filed Vitals:   08/31/14 0949  BP: 104/66  Pulse: 65  Temp: 98.3 F (36.8 C)  Resp: 20     There is no height on file to calculate BMI.    ECOG FS:0 - Asymptomatic  General: Well-developed, well-nourished, no acute distress. Eyes: anicteric sclera. Lungs: Clear to auscultation bilaterally. Heart:  Regular rate and rhythm. No rubs, murmurs, or gallops. Abdomen: Soft, nontender, nondistended. No organomegaly noted, normoactive bowel sounds. Musculoskeletal: 1-2+ bilateral lower extremity edema. Neuro: Alert, answering all questions appropriately. Cranial nerves grossly intact. Skin: No rashes or petechiae noted. Psych: Normal affect.   LAB RESULTS:  Lab Results  Component Value Date   NA 138 08/31/2014   K 3.9 08/31/2014   CL 107 08/31/2014   CO2 28 08/31/2014   GLUCOSE 91 08/31/2014   BUN 17 08/31/2014   CREATININE 1.15  08/31/2014   CALCIUM 9.4 08/31/2014   PROT 6.0* 08/31/2014   ALBUMIN 3.2* 08/31/2014   AST 18 08/31/2014   ALT 11* 08/31/2014   ALKPHOS 57 08/31/2014   BILITOT 0.8 08/31/2014   GFRNONAA >60 08/31/2014   GFRAA >60 08/31/2014    Lab Results  Component Value Date   WBC 4.0 08/31/2014   NEUTROABS 2.4 08/31/2014   HGB 13.4 08/31/2014   HCT 41.9 08/31/2014   MCV 94.8 08/31/2014   PLT 91* 08/31/2014     STUDIES: No results found.  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  PLAN:    1. Melanoma: PET results from June 01, 2014 were reviewed independently and reported improved disease burden. Patient also has now completed XRT to the lesion at his L5 vertebrae. Proceed with Nivolumab today.  He will also receive Zometa today. Return to clinic in 2 weeks for consideration of his next infusion of maintenance Nivolumab. Plan to reimage in July or August 2016. He expressed understanding and were in agreement with this plan. 2. GI bleed: Patient reports no further bleeding. He will likely need a local gastroenterologist for continued follow-up. No biopsies were taken during his EGD in [place] to determine whether this was unusual site of metastatic disease. 3. Anemia: Patient's hemoglobin is now within normal limits. 4. Peripheral edema: Continue Lasix as prescribed. 5. Diarrhea: Resolved.  Patient expressed understanding and was in agreement with this plan. He also understands that He can call clinic at any time with any questions, concerns, or complaints.   Melanoma of skin   Staging form: Melanoma of the Skin, AJCC 7th Edition     Clinical stage from 08/30/2014: Stage IV [place], NX, M1c) - Signed by Lloyd Huger, MD on 08/30/2014   Lloyd Huger, MD   09/08/2014 1:10 PM

## 2014-09-14 ENCOUNTER — Inpatient Hospital Stay: Payer: Medicare Other

## 2014-09-14 ENCOUNTER — Inpatient Hospital Stay (HOSPITAL_BASED_OUTPATIENT_CLINIC_OR_DEPARTMENT_OTHER): Payer: Medicare Other | Admitting: Internal Medicine

## 2014-09-14 ENCOUNTER — Encounter: Payer: Self-pay | Admitting: Internal Medicine

## 2014-09-14 VITALS — BP 112/70 | HR 93 | Temp 97.7°F | Resp 18 | Ht 66.5 in | Wt 229.9 lb

## 2014-09-14 DIAGNOSIS — D649 Anemia, unspecified: Secondary | ICD-10-CM | POA: Diagnosis not present

## 2014-09-14 DIAGNOSIS — C7951 Secondary malignant neoplasm of bone: Secondary | ICD-10-CM

## 2014-09-14 DIAGNOSIS — C439 Malignant melanoma of skin, unspecified: Secondary | ICD-10-CM | POA: Diagnosis not present

## 2014-09-14 DIAGNOSIS — Z8639 Personal history of other endocrine, nutritional and metabolic disease: Secondary | ICD-10-CM

## 2014-09-14 DIAGNOSIS — F17210 Nicotine dependence, cigarettes, uncomplicated: Secondary | ICD-10-CM

## 2014-09-14 DIAGNOSIS — Z79899 Other long term (current) drug therapy: Secondary | ICD-10-CM

## 2014-09-14 DIAGNOSIS — C7800 Secondary malignant neoplasm of unspecified lung: Secondary | ICD-10-CM | POA: Diagnosis not present

## 2014-09-14 DIAGNOSIS — Z7982 Long term (current) use of aspirin: Secondary | ICD-10-CM

## 2014-09-14 DIAGNOSIS — Z5111 Encounter for antineoplastic chemotherapy: Secondary | ICD-10-CM | POA: Diagnosis not present

## 2014-09-14 DIAGNOSIS — I10 Essential (primary) hypertension: Secondary | ICD-10-CM

## 2014-09-14 DIAGNOSIS — R609 Edema, unspecified: Secondary | ICD-10-CM

## 2014-09-14 DIAGNOSIS — Z923 Personal history of irradiation: Secondary | ICD-10-CM

## 2014-09-14 DIAGNOSIS — Z7952 Long term (current) use of systemic steroids: Secondary | ICD-10-CM

## 2014-09-14 LAB — CBC WITH DIFFERENTIAL/PLATELET
Basophils Absolute: 0.1 10*3/uL (ref 0–0.1)
Basophils Relative: 0 %
EOS ABS: 0 10*3/uL (ref 0–0.7)
Eosinophils Relative: 0 %
HEMATOCRIT: 45.3 % (ref 40.0–52.0)
HEMOGLOBIN: 14.5 g/dL (ref 13.0–18.0)
LYMPHS ABS: 2.4 10*3/uL (ref 1.0–3.6)
Lymphocytes Relative: 14 %
MCH: 29.7 pg (ref 26.0–34.0)
MCHC: 31.9 g/dL — ABNORMAL LOW (ref 32.0–36.0)
MCV: 93.2 fL (ref 80.0–100.0)
Monocytes Absolute: 1.9 10*3/uL — ABNORMAL HIGH (ref 0.2–1.0)
Monocytes Relative: 12 %
Neutro Abs: 12.3 10*3/uL — ABNORMAL HIGH (ref 1.4–6.5)
Neutrophils Relative %: 74 %
Platelets: 281 10*3/uL (ref 150–440)
RBC: 4.86 MIL/uL (ref 4.40–5.90)
RDW: 15.7 % — ABNORMAL HIGH (ref 11.5–14.5)
WBC: 16.8 10*3/uL — ABNORMAL HIGH (ref 3.8–10.6)

## 2014-09-14 LAB — COMPREHENSIVE METABOLIC PANEL
ALK PHOS: 87 U/L (ref 38–126)
ALT: 26 U/L (ref 17–63)
ANION GAP: 5 (ref 5–15)
AST: 22 U/L (ref 15–41)
Albumin: 3.2 g/dL — ABNORMAL LOW (ref 3.5–5.0)
BILIRUBIN TOTAL: 0.6 mg/dL (ref 0.3–1.2)
BUN: 27 mg/dL — AB (ref 6–20)
CHLORIDE: 102 mmol/L (ref 101–111)
CO2: 29 mmol/L (ref 22–32)
Calcium: 8.5 mg/dL — ABNORMAL LOW (ref 8.9–10.3)
Creatinine, Ser: 1.09 mg/dL (ref 0.61–1.24)
GFR calc non Af Amer: >60 mL/min (ref >60)
GLUCOSE: 90 mg/dL (ref 65–99)
POTASSIUM: 4.3 mmol/L (ref 3.5–5.1)
Sodium: 136 mmol/L (ref 135–145)
Total Protein: 6.6 g/dL (ref 6.5–8.1)

## 2014-09-14 LAB — TSH: TSH: 3.255 u[IU]/mL (ref 0.350–4.500)

## 2014-09-14 MED ORDER — ZOLEDRONIC ACID 4 MG/5ML IV CONC
4.0000 mg | Freq: Once | INTRAVENOUS | Status: AC
  Administered 2014-09-14: 4 mg via INTRAVENOUS
  Filled 2014-09-14: qty 5

## 2014-09-14 MED ORDER — NIVOLUMAB CHEMO INJECTION 100 MG/10ML: 3.0000 mg/kg | Freq: Once | INTRAVENOUS | Status: DC

## 2014-09-14 MED ORDER — SODIUM CHLORIDE 0.9 % IV SOLN
300.0000 mg | Freq: Once | INTRAVENOUS | Status: AC
  Administered 2014-09-14: 300 mg via INTRAVENOUS
  Filled 2014-09-14: qty 30

## 2014-09-14 MED ORDER — SODIUM CHLORIDE 0.9 % IV SOLN
Freq: Once | INTRAVENOUS | Status: AC
  Administered 2014-09-14: 11:00:00 via INTRAVENOUS
  Filled 2014-09-14: qty 1000

## 2014-09-15 ENCOUNTER — Other Ambulatory Visit: Payer: Self-pay | Admitting: *Deleted

## 2014-09-15 DIAGNOSIS — R197 Diarrhea, unspecified: Secondary | ICD-10-CM

## 2014-09-15 DIAGNOSIS — Z5189 Encounter for other specified aftercare: Secondary | ICD-10-CM

## 2014-09-15 LAB — T4: T4 TOTAL: 5.7 ug/dL (ref 4.5–12.0)

## 2014-09-15 MED ORDER — PREDNISONE 50 MG PO TABS: 50.0000 mg | ORAL_TABLET | Freq: Every day | ORAL | Status: DC

## 2014-09-15 NOTE — Telephone Encounter (Signed)
escribed

## 2014-09-17 NOTE — Progress Notes (Signed)
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 States Eddie Elliott is doing steady, denies new complaints. Denies recurrent diarrhea. Eddie Elliott continues to have lower extremity edema.Denies any further GI bleeding. No chest pain, shortness of breath, cough, or hemoptysis. Denies any fevers or weight loss. No nausea, vomiting, or constipation.   REVIEW OF SYSTEMS:   ROS As in HPI above. In addition, no fever, chills. No new headaches or focal weakness.  No new sore throat, cough, shortness of breath, sputum, hemoptysis or chest pain. No abdominal pain, constipation, diarrhea, dysuria or hematuria. No new skin rash or bleeding symptoms. No new paresthesias in extremities.   PAST MEDICAL HISTORY: Reviewed. Past Medical History  Diagnosis Date  . 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 Hypertension   . Gout     PAST SURGICAL HISTORY: Reviewed. Past Surgical History  Procedure Laterality Date  . Appendectomy    . Partial hip arthroplasty Right     FAMILY HISTORY: Reviewed. No family history on file.  SOCIAL HISTORY: Reviewed. History  Substance Use Topics  . Smoking status: Current Every Day Smoker -- 1.00 packs/day    Types: Cigarettes  . Smokeless tobacco: Never Used  . Alcohol Use: No    Allergies  Allergen Reactions  . No Known Allergies     Current Outpatient Prescriptions  Medication Sig Dispense Refill  . ALPRAZolam (XANAX) 0.25 MG tablet Take 1 tablet (0.25 mg total) by mouth 2 (two) times daily as needed for anxiety. 45 tablet 0  . aspirin  EC 81 MG tablet Take by mouth.    . carvedilol (COREG) 3.125 MG tablet Take by mouth.    . furosemide (LASIX) 20 MG tablet Take 1 tablet (20 mg total) by mouth daily. TAKE 1 TABLET (20 MG TOTAL) BY MOUTH ONCE a day to twice a day, PRN. 60 tablet 1  . HYDROcodone-acetaminophen (NORCO/VICODIN) 5-325 MG per tablet Take 1 tablet by mouth every 6 (six) hours as needed for moderate pain. 120 tablet 0  . prochlorperazine (COMPAZINE) 10 MG tablet Take 1 tablet (10 mg total) by mouth every 6 (six) hours as needed for nausea or vomiting. 120 tablet 1  . propafenone (RYTHMOL) 300 MG tablet Take 300 mg by mouth 2 (two) times daily.  0  . tamsulosin (FLOMAX) 0.4 MG CAPS capsule TAKE ONE CAPSULE BY MOUTH ONCE A DAY 30 MINUTES AFTER SAME MEAL EACH DAY    . predniSONE (DELTASONE) 50 MG tablet Take 1 tablet (50 mg total) by mouth daily with breakfast. 30 tablet 0   No current facility-administered medications for this visit.    PHYSICAL EXAM: Filed Vitals:   09/14/14 0949  BP: 112/70  Pulse: 93  Temp: 97.7 F (36.5 C)  Resp: 18     Body mass index is 36.56 kg/(m^2).      GENERAL: Alert and oriented and in no acute distress. No icterus. HEENT: EOMs intact. Oral exam negative for thrush. CVS: S1S2, regular LUNGS: Bilaterally clear to auscultation, no rhonchi. ABDOMEN: Soft, nontender.  EXTREMITIES: No pedal edema.  LAB RESULTS:    Component Value Date/Time   NA 136 09/14/2014 0931   NA 142 07/20/2014 0939   K 4.3 09/14/2014 0931   K 3.6 07/20/2014 0939   CL 102 09/14/2014 0931   CL 107 07/20/2014 0939   CO2 29 09/14/2014 0931   CO2 28 07/20/2014 0939   GLUCOSE 90 09/14/2014 0931   GLUCOSE 105* 07/20/2014 0939   BUN 27* 09/14/2014 0931   BUN 18 07/20/2014 0939   CREATININE 1.09 09/14/2014 0931   CREATININE 1.17 07/20/2014 0939   CALCIUM 8.5* 09/14/2014 0931   CALCIUM 7.9* 07/20/2014 0939   PROT 6.6 09/14/2014 0931   PROT 6.5 07/20/2014 0939   ALBUMIN 3.2* 09/14/2014 0931   ALBUMIN  3.4* 07/20/2014 0939   AST 22 09/14/2014 0931   AST 14* 07/20/2014 0939   ALT 26 09/14/2014 0931   ALT 8* 07/20/2014 0939   ALKPHOS 87 09/14/2014 0931   ALKPHOS 62 07/20/2014 0939   BILITOT 0.6 09/14/2014 0931   GFRNONAA >60 09/14/2014 0931   GFRNONAA >60 07/20/2014 0939   GFRAA >60 09/14/2014 0931   GFRAA >60 07/20/2014 0939   Lab Results  Component Value Date   WBC 16.8* 09/14/2014   NEUTROABS 12.3* 09/14/2014   HGB 14.5 09/14/2014   HCT 45.3 09/14/2014   MCV 93.2 09/14/2014   PLT 281 09/14/2014    STAGING: Melanoma of skin   Staging form: Melanoma of the Skin, AJCC 7th Edition     Clinical stage from 08/30/2014: Stage IV ([place], NX, M1c) - Signed by Lloyd Huger, MD on 08/30/2014   ASSESSMENT / PLAN:   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 1. Melanoma: PET results from June 01, 2014 were reviewed independently and reported improved disease burden. Patient has completed XRT to the lesion at his L5 vertebrae. Reviewed labs from today and d/w patient. Will proceed with next dose of Opdivo (Nivolumab) 3 mg/kg IV today. Return to clinic in 2 weeks with lab and see Dr.Finnegan to plan next infusion of maintenance Nivolumab.  2. GI bleed: Patient reports no further bleeding. Eddie Elliott will likely need a local gastroenterologist for continued follow-up. No biopsies were taken during his EGD in [place] to determine whether this was unusual site of metastatic disease. 3. Anemia: Patient's hemoglobin is now within normal limits. 4. Peripheral edema: Continue Lasix as prescribed. 5. Diarrhea: Resolved. In between visits, Eddie Elliott was advised to call or come to ER if progressive symptoms or acute sickness.   Leia Alf, MD   09/17/2014 10:08 PM     sure

## 2014-09-29 ENCOUNTER — Inpatient Hospital Stay: Payer: Medicare Other

## 2014-09-29 ENCOUNTER — Inpatient Hospital Stay (HOSPITAL_BASED_OUTPATIENT_CLINIC_OR_DEPARTMENT_OTHER): Payer: Medicare Other | Admitting: Oncology

## 2014-09-29 ENCOUNTER — Inpatient Hospital Stay: Payer: Medicare Other | Attending: Oncology

## 2014-09-29 VITALS — BP 129/88 | HR 140 | Temp 97.1°F | Resp 18 | Wt 239.4 lb

## 2014-09-29 DIAGNOSIS — R609 Edema, unspecified: Secondary | ICD-10-CM

## 2014-09-29 DIAGNOSIS — F17210 Nicotine dependence, cigarettes, uncomplicated: Secondary | ICD-10-CM

## 2014-09-29 DIAGNOSIS — C7800 Secondary malignant neoplasm of unspecified lung: Secondary | ICD-10-CM | POA: Diagnosis not present

## 2014-09-29 DIAGNOSIS — C434 Malignant melanoma of scalp and neck: Secondary | ICD-10-CM | POA: Insufficient documentation

## 2014-09-29 DIAGNOSIS — C7951 Secondary malignant neoplasm of bone: Secondary | ICD-10-CM

## 2014-09-29 DIAGNOSIS — Z9049 Acquired absence of other specified parts of digestive tract: Secondary | ICD-10-CM | POA: Diagnosis not present

## 2014-09-29 DIAGNOSIS — R197 Diarrhea, unspecified: Secondary | ICD-10-CM | POA: Diagnosis not present

## 2014-09-29 DIAGNOSIS — Z7982 Long term (current) use of aspirin: Secondary | ICD-10-CM | POA: Insufficient documentation

## 2014-09-29 DIAGNOSIS — Z8639 Personal history of other endocrine, nutritional and metabolic disease: Secondary | ICD-10-CM | POA: Insufficient documentation

## 2014-09-29 DIAGNOSIS — C439 Malignant melanoma of skin, unspecified: Secondary | ICD-10-CM

## 2014-09-29 DIAGNOSIS — Z79899 Other long term (current) drug therapy: Secondary | ICD-10-CM | POA: Diagnosis not present

## 2014-09-29 DIAGNOSIS — I10 Essential (primary) hypertension: Secondary | ICD-10-CM

## 2014-09-29 LAB — COMPREHENSIVE METABOLIC PANEL
ALBUMIN: 3.2 g/dL — AB (ref 3.5–5.0)
ALT: 23 U/L (ref 17–63)
AST: 19 U/L (ref 15–41)
Alkaline Phosphatase: 53 U/L (ref 38–126)
Anion gap: 4 — ABNORMAL LOW (ref 5–15)
BUN: 22 mg/dL — ABNORMAL HIGH (ref 6–20)
CALCIUM: 9.3 mg/dL (ref 8.9–10.3)
CO2: 28 mmol/L (ref 22–32)
Chloride: 103 mmol/L (ref 101–111)
Creatinine, Ser: 1.04 mg/dL (ref 0.61–1.24)
GFR calc Af Amer: >60 mL/min (ref >60)
GLUCOSE: 91 mg/dL (ref 65–99)
POTASSIUM: 4.1 mmol/L (ref 3.5–5.1)
SODIUM: 135 mmol/L (ref 135–145)
Total Bilirubin: 1 mg/dL (ref 0.3–1.2)
Total Protein: 5.9 g/dL — ABNORMAL LOW (ref 6.5–8.1)

## 2014-09-29 LAB — CBC WITH DIFFERENTIAL/PLATELET
BASOS ABS: 0.1 10*3/uL (ref 0–0.1)
BASOS PCT: 1 %
EOS PCT: 1 %
Eosinophils Absolute: 0.1 10*3/uL (ref 0–0.7)
HCT: 42.9 % (ref 40.0–52.0)
Hemoglobin: 13.5 g/dL (ref 13.0–18.0)
LYMPHS ABS: 2.3 10*3/uL (ref 1.0–3.6)
LYMPHS PCT: 19 %
MCH: 29.4 pg (ref 26.0–34.0)
MCHC: 31.6 g/dL — ABNORMAL LOW (ref 32.0–36.0)
MCV: 93 fL (ref 80.0–100.0)
MONO ABS: 1 10*3/uL (ref 0.2–1.0)
Monocytes Relative: 8 %
Neutro Abs: 8.9 10*3/uL — ABNORMAL HIGH (ref 1.4–6.5)
Neutrophils Relative %: 71 %
Platelets: 121 10*3/uL — ABNORMAL LOW (ref 150–440)
RBC: 4.61 MIL/uL (ref 4.40–5.90)
RDW: 15.9 % — ABNORMAL HIGH (ref 11.5–14.5)
WBC: 12.4 10*3/uL — AB (ref 3.8–10.6)

## 2014-09-29 MED ORDER — SODIUM CHLORIDE 0.9 % IV SOLN
300.0000 mg | Freq: Once | INTRAVENOUS | Status: AC
  Administered 2014-09-29: 300 mg via INTRAVENOUS
  Filled 2014-09-29: qty 30

## 2014-09-29 MED ORDER — SODIUM CHLORIDE 0.9 % IV SOLN
Freq: Once | INTRAVENOUS | Status: AC
  Administered 2014-09-29: 11:00:00 via INTRAVENOUS
  Filled 2014-09-29: qty 1000

## 2014-09-29 MED ORDER — PREDNISONE 10 MG PO TABS: ORAL_TABLET | ORAL | Status: DC

## 2014-09-29 NOTE — Progress Notes (Signed)
Patient reports having loose bowels that are not to frequent and he does not consider it to be diarrhea.

## 2014-10-07 NOTE — Progress Notes (Signed)
[place]  Telephone:(336) [phone] Fax:(336) [phone]  ID: Eddie Elliott OB: February 28, 1941  MR#: [id number]  EPP#:[id number]  Patient Care Team: Juluis Pitch, MD as PCP - General (Family Medicine)  CHIEF COMPLAINT:  Chief Complaint  Patient presents with  . Follow-up    melanoma of skin     INTERVAL HISTORY: Patient returns to clinic today for further evaluation and consideration of his next infusion of nivolumab. His diarrhea has resolved. He continues to have lower extremity edema.  He has no neurologic complaints. He denies any further GI bleeding. He denies any chest pain, shortness of breath, cough, or hemoptysis. He denies any fevers or weight loss. He denies any nausea, vomiting, or constipation. He has no urinary complaints. Patient offers no further specific complaints today.   REVIEW OF SYSTEMS:   Review of Systems  Constitutional: Negative for malaise/fatigue.  Respiratory: Negative.   Cardiovascular: Positive for leg swelling.  Gastrointestinal: Negative for diarrhea.  Neurological: Negative for weakness.    As per HPI. Otherwise, a complete review of systems is negatve.  PAST MEDICAL HISTORY: Past Medical History  Diagnosis Date  . 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 Hypertension   . Gout     PAST SURGICAL HISTORY: Past Surgical History  Procedure Laterality Date  . Appendectomy    . Partial hip arthroplasty Right     FAMILY HISTORY: Reviewed and unchanged. No reported history of malignancy or chronic disease.     ADVANCED DIRECTIVES:    HEALTH MAINTENANCE: History  Substance Use Topics  . Smoking status: Current Every Day Smoker -- 1.00 packs/day    Types: Cigarettes  . Smokeless tobacco: Never Used  . Alcohol Use: No     Colonoscopy:  PAP:  Bone density:  Lipid panel:  Allergies  Allergen Reactions  . No Known Allergies      Current Outpatient Prescriptions  Medication Sig Dispense Refill  . ALPRAZolam (XANAX) 0.25 MG tablet Take 1 tablet (0.25 mg total) by mouth 2 (two) times daily as needed for anxiety. 45 tablet 0  . aspirin EC 81 MG tablet Take by mouth.    . carvedilol (COREG) 3.125 MG tablet Take by mouth.    . furosemide (LASIX) 20 MG tablet Take 1 tablet (20 mg total) by mouth daily. TAKE 1 TABLET (20 MG TOTAL) BY MOUTH ONCE a day to twice a day, PRN. 60 tablet 1  . HYDROcodone-acetaminophen (NORCO/VICODIN) 5-325 MG per tablet Take 1 tablet by mouth every 6 (six) hours as needed for moderate pain. 120 tablet 0  . prochlorperazine (COMPAZINE) 10 MG tablet Take 1 tablet (10 mg total) by mouth every 6 (six) hours as needed for nausea or vomiting. 120 tablet 1  . propafenone (RYTHMOL) 300 MG tablet Take 300 mg by mouth 2 (two) times daily.  0  . tamsulosin (FLOMAX) 0.4 MG CAPS capsule TAKE ONE CAPSULE BY MOUTH ONCE A DAY 30 MINUTES AFTER SAME MEAL EACH DAY    . predniSONE (DELTASONE) 10 MG tablet Take 4 tabs (4m) for 4 days, then 2 tab (29m for 4 days, then 1 tab (1043mfor 4 days, then 1/2 tab (5mg64mor 4 days, then 1/4 tab for 4 days (2.5mg)32men stop. 32 tablet 0   No current facility-administered medications for this visit.    OBJECTIVE: Filed Vitals:   09/29/14 1004  BP: 129/88  Pulse: 140  Temp: 97.1 F (36.2 C)  Resp: 18     Body mass index is 38.07 kg/(m^2).    ECOG FS:0 - Asymptomatic  General: Well-developed, well-nourished, no acute distress. Eyes: anicteric sclera. Lungs: Clear to auscultation bilaterally. Heart: Regular rate and rhythm. No rubs, murmurs, or gallops. Abdomen: Soft, nontender, nondistended. No organomegaly noted, normoactive bowel sounds. Musculoskeletal: 1+ bilateral lower extremity edema. Neuro: Alert, answering all questions appropriately. Cranial nerves grossly intact. Skin: No rashes or petechiae noted. Psych: Normal affect.   LAB RESULTS:  Lab Results   Component Value Date   NA 135 09/29/2014   K 4.1 09/29/2014   CL 103 09/29/2014   CO2 28 09/29/2014   GLUCOSE 91 09/29/2014   BUN 22* 09/29/2014   CREATININE 1.04 09/29/2014   CALCIUM 9.3 09/29/2014   PROT 5.9* 09/29/2014   ALBUMIN 3.2* 09/29/2014   AST 19 09/29/2014   ALT 23 09/29/2014   ALKPHOS 53 09/29/2014   BILITOT 1.0 09/29/2014   GFRNONAA >60 09/29/2014   GFRAA >60 09/29/2014    Lab Results  Component Value Date   WBC 12.4* 09/29/2014   NEUTROABS 8.9* 09/29/2014   HGB 13.5 09/29/2014   HCT 42.9 09/29/2014   MCV 93.0 09/29/2014   PLT 121* 09/29/2014     STUDIES: No results found.  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  PLAN:    1. Melanoma: PET results from June 01, 2014 were reviewed independently and reported improved disease burden. Patient also has now completed XRT to the lesion at his L5 vertebrae. Proceed with Nivolumab today.  He will also receive Zometa today. Return to clinic in 2 weeks for consideration of his next infusion of maintenance Nivolumab. Plan to reimage in ~August 2016.  2. GI bleed: Patient reports no further bleeding. He will likely need a local gastroenterologist for continued follow-up. No biopsies were taken during his EGD in [place] to determine whether this was unusual site of metastatic disease. 3. Anemia: Patient's hemoglobin is now within normal limits. 4. Peripheral edema: Continue Lasix as prescribed. 5. Diarrhea: Will taper patient off steroids and consider Remicade in the near future.  Patient expressed understanding and was in agreement with this plan. He also understands that He can call clinic at any time with any questions, concerns, or complaints.   Melanoma of skin   Staging form: Melanoma of the Skin, AJCC 7th Edition     Clinical stage from 08/30/2014: Stage IV [place], NX, M1c) - Signed by Lloyd Huger, MD on 08/30/2014   Lloyd Huger, MD   10/07/2014 9:08  AM

## 2014-10-12 ENCOUNTER — Ambulatory Visit: Payer: Medicare Other

## 2014-10-12 ENCOUNTER — Inpatient Hospital Stay: Payer: Medicare Other

## 2014-10-12 ENCOUNTER — Inpatient Hospital Stay (HOSPITAL_BASED_OUTPATIENT_CLINIC_OR_DEPARTMENT_OTHER): Payer: Medicare Other | Admitting: Oncology

## 2014-10-12 VITALS — BP 112/70 | HR 70 | Temp 96.1°F | Wt 235.2 lb

## 2014-10-12 DIAGNOSIS — C7800 Secondary malignant neoplasm of unspecified lung: Secondary | ICD-10-CM | POA: Diagnosis not present

## 2014-10-12 DIAGNOSIS — R609 Edema, unspecified: Secondary | ICD-10-CM

## 2014-10-12 DIAGNOSIS — C439 Malignant melanoma of skin, unspecified: Secondary | ICD-10-CM

## 2014-10-12 DIAGNOSIS — I10 Essential (primary) hypertension: Secondary | ICD-10-CM

## 2014-10-12 DIAGNOSIS — C7951 Secondary malignant neoplasm of bone: Secondary | ICD-10-CM

## 2014-10-12 DIAGNOSIS — C434 Malignant melanoma of scalp and neck: Secondary | ICD-10-CM | POA: Diagnosis not present

## 2014-10-12 DIAGNOSIS — Z7982 Long term (current) use of aspirin: Secondary | ICD-10-CM

## 2014-10-12 DIAGNOSIS — F17210 Nicotine dependence, cigarettes, uncomplicated: Secondary | ICD-10-CM

## 2014-10-12 DIAGNOSIS — Z79899 Other long term (current) drug therapy: Secondary | ICD-10-CM

## 2014-10-12 DIAGNOSIS — Z9049 Acquired absence of other specified parts of digestive tract: Secondary | ICD-10-CM

## 2014-10-12 DIAGNOSIS — R197 Diarrhea, unspecified: Secondary | ICD-10-CM

## 2014-10-12 DIAGNOSIS — Z8639 Personal history of other endocrine, nutritional and metabolic disease: Secondary | ICD-10-CM

## 2014-10-12 LAB — CBC WITH DIFFERENTIAL/PLATELET
BASOS ABS: 0 10*3/uL (ref 0–0.1)
BASOS PCT: 1 %
Eosinophils Absolute: 0.1 10*3/uL (ref 0–0.7)
Eosinophils Relative: 2 %
HCT: 44.7 % (ref 40.0–52.0)
Hemoglobin: 14.2 g/dL (ref 13.0–18.0)
LYMPHS ABS: 1.5 10*3/uL (ref 1.0–3.6)
LYMPHS PCT: 20 %
MCH: 29.1 pg (ref 26.0–34.0)
MCHC: 31.8 g/dL — AB (ref 32.0–36.0)
MCV: 91.5 fL (ref 80.0–100.0)
Monocytes Absolute: 0.8 10*3/uL (ref 0.2–1.0)
Monocytes Relative: 10 %
NEUTROS PCT: 67 %
Neutro Abs: 5.3 10*3/uL (ref 1.4–6.5)
Platelets: 172 10*3/uL (ref 150–440)
RBC: 4.88 MIL/uL (ref 4.40–5.90)
RDW: 16 % — ABNORMAL HIGH (ref 11.5–14.5)
WBC: 7.7 10*3/uL (ref 3.8–10.6)

## 2014-10-12 LAB — COMPREHENSIVE METABOLIC PANEL
ALBUMIN: 3.4 g/dL — AB (ref 3.5–5.0)
ALK PHOS: 69 U/L (ref 38–126)
ALT: 17 U/L (ref 17–63)
AST: 19 U/L (ref 15–41)
Anion gap: 7 (ref 5–15)
BUN: 25 mg/dL — AB (ref 6–20)
CALCIUM: 8.6 mg/dL — AB (ref 8.9–10.3)
CO2: 30 mmol/L (ref 22–32)
Chloride: 103 mmol/L (ref 101–111)
Creatinine, Ser: 1.25 mg/dL — ABNORMAL HIGH (ref 0.61–1.24)
GFR calc Af Amer: >60 mL/min (ref >60)
GFR calc non Af Amer: 55 mL/min — ABNORMAL LOW (ref >60)
Glucose, Bld: 99 mg/dL (ref 65–99)
Potassium: 4.3 mmol/L (ref 3.5–5.1)
SODIUM: 140 mmol/L (ref 135–145)
Total Bilirubin: 0.9 mg/dL (ref 0.3–1.2)
Total Protein: 6.4 g/dL — ABNORMAL LOW (ref 6.5–8.1)

## 2014-10-12 MED ORDER — HEPARIN SOD (PORK) LOCK FLUSH 100 UNIT/ML IV SOLN: 500.0000 [IU] | Freq: Once | INTRAVENOUS | Status: DC | PRN

## 2014-10-12 MED ORDER — SODIUM CHLORIDE 0.9 % IV SOLN
300.0000 mg | Freq: Once | INTRAVENOUS | Status: AC
  Administered 2014-10-12: 300 mg via INTRAVENOUS
  Filled 2014-10-12: qty 30

## 2014-10-12 MED ORDER — NIVOLUMAB CHEMO INJECTION 100 MG/10ML: 3.0000 mg/kg | Freq: Once | INTRAVENOUS | Status: DC

## 2014-10-12 MED ORDER — SODIUM CHLORIDE 0.9 % IV SOLN
Freq: Once | INTRAVENOUS | Status: AC
  Administered 2014-10-12: 10:00:00 via INTRAVENOUS
  Filled 2014-10-12: qty 1000

## 2014-10-24 NOTE — Progress Notes (Signed)
[place]  Telephone:(336) [phone] Fax:(336) [phone]  ID: Bebe Liter OB: 1940/12/01  MR#: [id number]  MAU#:[id number]  Patient Care Team: Juluis Pitch, MD as PCP - General (Family Medicine)  CHIEF COMPLAINT:  Chief Complaint  Patient presents with  . Follow-up    melanoma     INTERVAL HISTORY: Patient returns to clinic today for further evaluation and consideration of his next infusion of nivolumab. He only complains of increased weakness and fatigue.  His diarrhea has resolved. He continues to have lower extremity edema, but this is improved as well.  He has no neurologic complaints. He denies any further GI bleeding. He denies any chest pain, shortness of breath, cough, or hemoptysis. He denies any fevers or weight loss. He denies any nausea, vomiting, or constipation. He has no urinary complaints. Patient offers no further specific complaints today.   REVIEW OF SYSTEMS:   Review of Systems  Constitutional: Positive for malaise/fatigue.  Respiratory: Negative.   Cardiovascular: Positive for leg swelling.  Gastrointestinal: Negative for diarrhea.  Neurological: Positive for weakness.    As per HPI. Otherwise, a complete review of systems is negatve.  PAST MEDICAL HISTORY: Past Medical History  Diagnosis Date  . 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 Hypertension   . Gout     PAST SURGICAL HISTORY: Past Surgical History  Procedure Laterality Date  . Appendectomy    . Partial hip arthroplasty Right     FAMILY HISTORY: Reviewed and unchanged. No reported history of malignancy or chronic disease.     ADVANCED DIRECTIVES:    HEALTH MAINTENANCE: History  Substance Use Topics  . Smoking status: Current Every Day Smoker -- 1.00 packs/day    Types: Cigarettes  . Smokeless tobacco: Never Used  . Alcohol Use: No     Colonoscopy:  PAP:  Bone  density:  Lipid panel:  Allergies  Allergen Reactions  . No Known Allergies     Current Outpatient Prescriptions  Medication Sig Dispense Refill  . ALPRAZolam (XANAX) 0.25 MG tablet Take 1 tablet (0.25 mg total) by mouth 2 (two) times daily as needed for anxiety. 45 tablet 0  . aspirin EC 81 MG tablet Take by mouth.    . carvedilol (COREG) 3.125 MG tablet Take by mouth.    . furosemide (LASIX) 20 MG tablet Take 1 tablet (20 mg total) by mouth daily. TAKE 1 TABLET (20 MG TOTAL) BY MOUTH ONCE a day to twice a day, PRN. 60 tablet 1  . HYDROcodone-acetaminophen (NORCO/VICODIN) 5-325 MG per tablet Take 1 tablet by mouth every 6 (six) hours as needed for moderate pain. 120 tablet 0  . predniSONE (DELTASONE) 10 MG tablet Take 4 tabs (52m) for 4 days, then 2 tab (27m for 4 days, then 1 tab (1071mfor 4 days, then 1/2 tab (5mg39mor 4 days, then 1/4 tab for 4 days (2.5mg)17men stop. 32 tablet 0  . prochlorperazine (COMPAZINE) 10 MG tablet Take 1 tablet (10 mg total) by mouth every 6 (six) hours as needed for nausea or vomiting. 120 tablet 1  . propafenone (RYTHMOL) 300 MG tablet Take 300 mg by mouth 2 (two) times daily.  0  . tamsulosin (FLOMAX) 0.4 MG CAPS capsule TAKE ONE CAPSULE BY MOUTH ONCE A DAY 30 MINUTES AFTER SAME MEAL EACH DAY     No current facility-administered medications for this visit.    OBJECTIVE:  Filed Vitals:   10/12/14 0926  BP: 112/70  Pulse: 70  Temp: 96.1 F (35.6 C)     Body mass index is 37.4 kg/(m^2).    ECOG FS:0 - Asymptomatic  General: Well-developed, well-nourished, no acute distress. Eyes: anicteric sclera. Lungs: Clear to auscultation bilaterally. Heart: Regular rate and rhythm. No rubs, murmurs, or gallops. Abdomen: Soft, nontender, nondistended. No organomegaly noted, normoactive bowel sounds. Musculoskeletal: 1+ bilateral lower extremity edema. Neuro: Alert, answering all questions appropriately. Cranial nerves grossly intact. Skin: No rashes or  petechiae noted. Psych: Normal affect.   LAB RESULTS:  Lab Results  Component Value Date   NA 140 10/12/2014   K 4.3 10/12/2014   CL 103 10/12/2014   CO2 30 10/12/2014   GLUCOSE 99 10/12/2014   BUN 25* 10/12/2014   CREATININE 1.25* 10/12/2014   CALCIUM 8.6* 10/12/2014   PROT 6.4* 10/12/2014   ALBUMIN 3.4* 10/12/2014   AST 19 10/12/2014   ALT 17 10/12/2014   ALKPHOS 69 10/12/2014   BILITOT 0.9 10/12/2014   GFRNONAA 55* 10/12/2014   GFRAA >60 10/12/2014    Lab Results  Component Value Date   WBC 7.7 10/12/2014   NEUTROABS 5.3 10/12/2014   HGB 14.2 10/12/2014   HCT 44.7 10/12/2014   MCV 91.5 10/12/2014   PLT 172 10/12/2014     STUDIES: No results found.  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  PLAN:    1. Melanoma: PET results from June 01, 2014 were reviewed independently and reported improved disease burden. Patient also has now completed XRT to the lesion at his L5 vertebrae. Proceed with Nivolumab today.  He will also receive Zometa every other treatment. Return to clinic in 2 weeks for consideration of his next infusion of maintenance Nivolumab. Plan to reimage in ~August 2016.  2. GI bleed: Patient reports no further bleeding. He will likely need a local gastroenterologist for continued follow-up. No biopsies were taken during his EGD in [place] to determine whether this was unusual site of metastatic disease. 3. Anemia: Patient's hemoglobin is now within normal limits. 4. Peripheral edema: Continue Lasix as prescribed. 5. Diarrhea: Steroids have been tapered off and consider Remicade in the future if it recurs.   Patient expressed understanding and was in agreement with this plan. He also understands that He can call clinic at any time with any questions, concerns, or complaints.   Melanoma of skin   Staging form: Melanoma of the Skin, AJCC 7th Edition     Clinical stage from 08/30/2014: Stage IV [place], NX,  M1c) - Signed by Lloyd Huger, MD on 08/30/2014   Lloyd Huger, MD   10/24/2014 8:26 AM

## 2014-10-26 ENCOUNTER — Inpatient Hospital Stay: Payer: Medicare Other | Attending: Oncology

## 2014-10-26 ENCOUNTER — Inpatient Hospital Stay (HOSPITAL_BASED_OUTPATIENT_CLINIC_OR_DEPARTMENT_OTHER): Payer: Medicare Other | Admitting: Oncology

## 2014-10-26 ENCOUNTER — Inpatient Hospital Stay: Payer: Medicare Other

## 2014-10-26 ENCOUNTER — Encounter: Payer: Self-pay | Admitting: Oncology

## 2014-10-26 ENCOUNTER — Other Ambulatory Visit: Payer: Self-pay | Admitting: Oncology

## 2014-10-26 VITALS — BP 111/75 | HR 96 | Temp 97.9°F | Ht 67.0 in | Wt 252.6 lb

## 2014-10-26 DIAGNOSIS — C7800 Secondary malignant neoplasm of unspecified lung: Secondary | ICD-10-CM | POA: Diagnosis not present

## 2014-10-26 DIAGNOSIS — R918 Other nonspecific abnormal finding of lung field: Secondary | ICD-10-CM | POA: Diagnosis not present

## 2014-10-26 DIAGNOSIS — Z5111 Encounter for antineoplastic chemotherapy: Secondary | ICD-10-CM | POA: Insufficient documentation

## 2014-10-26 DIAGNOSIS — F17210 Nicotine dependence, cigarettes, uncomplicated: Secondary | ICD-10-CM | POA: Diagnosis not present

## 2014-10-26 DIAGNOSIS — M7989 Other specified soft tissue disorders: Secondary | ICD-10-CM | POA: Diagnosis not present

## 2014-10-26 DIAGNOSIS — C439 Malignant melanoma of skin, unspecified: Secondary | ICD-10-CM | POA: Diagnosis not present

## 2014-10-26 DIAGNOSIS — Z79899 Other long term (current) drug therapy: Secondary | ICD-10-CM | POA: Diagnosis not present

## 2014-10-26 DIAGNOSIS — R635 Abnormal weight gain: Secondary | ICD-10-CM | POA: Insufficient documentation

## 2014-10-26 DIAGNOSIS — I10 Essential (primary) hypertension: Secondary | ICD-10-CM | POA: Diagnosis not present

## 2014-10-26 DIAGNOSIS — I509 Heart failure, unspecified: Secondary | ICD-10-CM

## 2014-10-26 DIAGNOSIS — C7951 Secondary malignant neoplasm of bone: Secondary | ICD-10-CM | POA: Insufficient documentation

## 2014-10-26 DIAGNOSIS — I2510 Atherosclerotic heart disease of native coronary artery without angina pectoris: Secondary | ICD-10-CM | POA: Diagnosis not present

## 2014-10-26 DIAGNOSIS — R5383 Other fatigue: Secondary | ICD-10-CM | POA: Diagnosis not present

## 2014-10-26 DIAGNOSIS — R609 Edema, unspecified: Secondary | ICD-10-CM | POA: Diagnosis not present

## 2014-10-26 DIAGNOSIS — M109 Gout, unspecified: Secondary | ICD-10-CM | POA: Diagnosis not present

## 2014-10-26 DIAGNOSIS — Z7982 Long term (current) use of aspirin: Secondary | ICD-10-CM | POA: Diagnosis not present

## 2014-10-26 DIAGNOSIS — D3500 Benign neoplasm of unspecified adrenal gland: Secondary | ICD-10-CM | POA: Insufficient documentation

## 2014-10-26 LAB — COMPREHENSIVE METABOLIC PANEL
ALT: 13 U/L — AB (ref 17–63)
AST: 16 U/L (ref 15–41)
Albumin: 3.2 g/dL — ABNORMAL LOW (ref 3.5–5.0)
Alkaline Phosphatase: 63 U/L (ref 38–126)
Anion gap: 5 (ref 5–15)
BUN: 29 mg/dL — ABNORMAL HIGH (ref 6–20)
CO2: 29 mmol/L (ref 22–32)
Calcium: 9.7 mg/dL (ref 8.9–10.3)
Chloride: 108 mmol/L (ref 101–111)
Creatinine, Ser: 1.2 mg/dL (ref 0.61–1.24)
GFR calc Af Amer: >60 mL/min (ref >60)
GFR calc non Af Amer: 58 mL/min — ABNORMAL LOW (ref >60)
Glucose, Bld: 110 mg/dL — ABNORMAL HIGH (ref 65–99)
Potassium: 3.9 mmol/L (ref 3.5–5.1)
Sodium: 142 mmol/L (ref 135–145)
Total Bilirubin: 0.7 mg/dL (ref 0.3–1.2)
Total Protein: 6.5 g/dL (ref 6.5–8.1)

## 2014-10-26 LAB — CBC WITH DIFFERENTIAL/PLATELET
BASOS ABS: 0 10*3/uL (ref 0–0.1)
BASOS PCT: 0 %
Eosinophils Absolute: 0 10*3/uL (ref 0–0.7)
Eosinophils Relative: 0 %
HCT: 39.8 % — ABNORMAL LOW (ref 40.0–52.0)
HEMOGLOBIN: 12.8 g/dL — AB (ref 13.0–18.0)
LYMPHS ABS: 1.9 10*3/uL (ref 1.0–3.6)
Lymphocytes Relative: 18 %
MCH: 29.1 pg (ref 26.0–34.0)
MCHC: 32.3 g/dL (ref 32.0–36.0)
MCV: 90.1 fL (ref 80.0–100.0)
MONOS PCT: 12 %
Monocytes Absolute: 1.3 10*3/uL — ABNORMAL HIGH (ref 0.2–1.0)
NEUTROS ABS: 7.4 10*3/uL — AB (ref 1.4–6.5)
Neutrophils Relative %: 70 %
PLATELETS: 175 10*3/uL (ref 150–440)
RBC: 4.41 MIL/uL (ref 4.40–5.90)
RDW: 16 % — ABNORMAL HIGH (ref 11.5–14.5)
WBC: 10.6 10*3/uL (ref 3.8–10.6)

## 2014-10-26 MED ORDER — SODIUM CHLORIDE 0.9 % IV SOLN
340.0000 mg | Freq: Once | INTRAVENOUS | Status: AC
  Administered 2014-10-26: 340 mg via INTRAVENOUS
  Filled 2014-10-26: qty 26

## 2014-10-26 MED ORDER — SODIUM CHLORIDE 0.9 % IV SOLN: 3.0000 mg/kg | Freq: Once | INTRAVENOUS | Status: DC

## 2014-10-26 MED ORDER — SODIUM CHLORIDE 0.9 % IV SOLN
Freq: Once | INTRAVENOUS | Status: AC
  Administered 2014-10-26: 10:00:00 via INTRAVENOUS
  Filled 2014-10-26: qty 1000

## 2014-10-26 MED ORDER — ZOLEDRONIC ACID 4 MG/100ML IV SOLN
4.0000 mg | Freq: Once | INTRAVENOUS | Status: AC
  Administered 2014-10-26: 4 mg via INTRAVENOUS
  Filled 2014-10-26: qty 100

## 2014-10-26 MED ORDER — ZOLEDRONIC ACID 4 MG/5ML IV CONC: 4.0000 mg | Freq: Once | INTRAVENOUS | Status: DC

## 2014-10-26 NOTE — Progress Notes (Signed)
[place]  Telephone:(336) [phone] Fax:(336) [phone]  ID: Bebe Liter OB: [birth year]  MR#: [id number]  FOY#:[id number]  Patient Care Team: Juluis Pitch, MD as PCP - General (Family Medicine)  CHIEF COMPLAINT:  Chief Complaint  Patient presents with  . Follow-up     INTERVAL HISTORY: Patient returns to clinic today for further evaluation and consideration of his next infusion of nivolumab. He had an episode of gout this past week and is now back on a prednisone taper. He is also complaining of increased weight gain. He continues to have lower extremity edema, but this is improved.  He has no neurologic complaints. He denies any further GI bleeding. He denies any chest pain, shortness of breath, cough, or hemoptysis. He denies any fevers or weight loss. He denies any nausea, vomiting, or constipation. He has no urinary complaints. Patient offers no further specific complaints today.   REVIEW OF SYSTEMS:   Review of Systems  Constitutional: Negative.   Respiratory: Negative.   Cardiovascular: Positive for leg swelling.  Gastrointestinal: Negative for diarrhea.    As per HPI. Otherwise, a complete review of systems is negatve.  PAST MEDICAL HISTORY: Past Medical History  Diagnosis Date  . 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 Hypertension   . Gout   . CHF (congestive heart failure)     PAST SURGICAL HISTORY: Past Surgical History  Procedure Laterality Date  . Appendectomy    . Partial hip arthroplasty Right     FAMILY HISTORY: Reviewed and unchanged. No reported history of malignancy or chronic disease.     ADVANCED DIRECTIVES:    HEALTH MAINTENANCE: History  Substance Use Topics  . Smoking status: Current Every Day Smoker -- 1.00 packs/day    Types: Cigarettes  . Smokeless tobacco: Never Used  . Alcohol Use: No     Colonoscopy:  PAP:  Bone  density:  Lipid panel:  Allergies  Allergen Reactions  . No Known Allergies     Current Outpatient Prescriptions  Medication Sig Dispense Refill  . aspirin EC 81 MG tablet Take by mouth.    . carvedilol (COREG) 3.125 MG tablet Take by mouth.    . furosemide (LASIX) 20 MG tablet Take 1 tablet (20 mg total) by mouth daily. TAKE 1 TABLET (20 MG TOTAL) BY MOUTH ONCE a day to twice a day, PRN. 60 tablet 1  . HYDROcodone-acetaminophen (NORCO/VICODIN) 5-325 MG per tablet Take 1 tablet by mouth every 6 (six) hours as needed for moderate pain. 120 tablet 0  . propafenone (RYTHMOL) 300 MG tablet Take 300 mg by mouth 2 (two) times daily.  0  . ALPRAZolam (XANAX) 0.25 MG tablet Take 1 tablet (0.25 mg total) by mouth 2 (two) times daily as needed for anxiety. (Patient not taking: Reported on 10/26/2014) 45 tablet 0  . predniSONE (DELTASONE) 10 MG tablet Take 4 tabs (64m) for 4 days, then 2 tab (234m for 4 days, then 1 tab (1035mfor 4 days, then 1/2 tab (5mg44mor 4 days, then 1/4 tab for 4 days (2.5mg)58men stop. 32 tablet 0  . prochlorperazine (COMPAZINE) 10 MG tablet Take 1 tablet (10 mg total) by mouth every 6 (six) hours as needed for nausea or vomiting. (Patient not taking: Reported on 10/26/2014) 120 tablet 1  . tamsulosin (FLOMAX) 0.4 MG CAPS capsule TAKE ONE CAPSULE BY MOUTH ONCE A DAY 30 MINUTES AFTER  SAME MEAL EACH DAY     No current facility-administered medications for this visit.   Facility-Administered Medications Ordered in Other Visits  Medication Dose Route Frequency Provider Last Rate Last Dose  . 0.9 %  sodium chloride infusion   Intravenous Once Lloyd Huger, MD      . nivolumab (OPDIVO) 310 mg in sodium chloride 0.9 % 100 mL chemo infusion  3 mg/kg (Order-Specific) Intravenous Once Lloyd Huger, MD        OBJECTIVE: Filed Vitals:   10/26/14 0905  BP: 111/75  Pulse: 96  Temp: 97.9 F (36.6 C)     Body mass index is 39.56 kg/(m^2).    ECOG FS:0 -  Asymptomatic  General: Well-developed, well-nourished, no acute distress. Eyes: anicteric sclera. Lungs: Clear to auscultation bilaterally. Heart: Regular rate and rhythm. No rubs, murmurs, or gallops. Abdomen: Soft, nontender, nondistended. No organomegaly noted, normoactive bowel sounds. Musculoskeletal: 1+ bilateral lower extremity edema. Neuro: Alert, answering all questions appropriately. Cranial nerves grossly intact. Skin: No rashes or petechiae noted. Psych: Normal affect.   LAB RESULTS:  Lab Results  Component Value Date   NA 142 10/26/2014   K 3.9 10/26/2014   CL 108 10/26/2014   CO2 29 10/26/2014   GLUCOSE 110* 10/26/2014   BUN 29* 10/26/2014   CREATININE 1.20 10/26/2014   CALCIUM 9.7 10/26/2014   PROT 6.5 10/26/2014   ALBUMIN 3.2* 10/26/2014   AST 16 10/26/2014   ALT 13* 10/26/2014   ALKPHOS 63 10/26/2014   BILITOT 0.7 10/26/2014   GFRNONAA 58* 10/26/2014   GFRAA >60 10/26/2014    Lab Results  Component Value Date   WBC 10.6 10/26/2014   NEUTROABS 7.4* 10/26/2014   HGB 12.8* 10/26/2014   HCT 39.8* 10/26/2014   MCV 90.1 10/26/2014   PLT 175 10/26/2014     STUDIES: No results found.  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  PLAN:    1. Melanoma: PET results from June 01, 2014 were reviewed independently and reported improved disease burden. Patient also has now completed XRT to the lesion at his L5 vertebrae. Proceed with Nivolumab today.  He will also receive Zometa every other treatment. Return to clinic in 2 weeks for consideration of his next infusion of maintenance Nivolumab. Plan to reimage in after his next infusion at the end of August.  2. GI bleed: Patient reports no further bleeding. He will likely need a local gastroenterologist for continued follow-up. No biopsies were taken during his EGD in [place] to determine whether this was unusual site of metastatic disease. 3. Anemia: Patient's  hemoglobin is now within normal limits. 4. Peripheral edema: Continue Lasix as prescribed. 5. Diarrhea: Steroids have been tapered off and consider Remicade in the future if it recurs.  6. Weight gain: Likely secondary to prednisone. Monitor.  Patient expressed understanding and was in agreement with this plan. He also understands that He can call clinic at any time with any questions, concerns, or complaints.   Melanoma of skin   Staging form: Melanoma of the Skin, AJCC 7th Edition     Clinical stage from 08/30/2014: Stage IV [place], NX, M1c) - Signed by Lloyd Huger, MD on 08/30/2014   Lloyd Huger, MD   10/26/2014 9:36 AM

## 2014-10-26 NOTE — Progress Notes (Signed)
   10/26/14 1000  Clinical Encounter Type  Visited With Patient and family together  Visit Type Initial  Referral From Nurse  Consult/Referral To Chaplain  Spiritual Encounters  Spiritual Needs Literature  Advance Directives (For Healthcare)  Does patient have an advance directive? Yes  Would patient like information on creating an advanced directive? Yes - Educational materials given  Copy of advanced directive(s) in chart? Yes  Completed Advance Directive forms and notarized for patient and his wife in the cancer center.  [place] [phone]

## 2014-10-26 NOTE — Progress Notes (Signed)
Pt here today for follow up regarding melanoma; c/o gaining 30 pounds in 5 weeks; pt feels it is from prednisone; pt resides in [place];

## 2014-11-09 ENCOUNTER — Inpatient Hospital Stay (HOSPITAL_BASED_OUTPATIENT_CLINIC_OR_DEPARTMENT_OTHER): Payer: Medicare Other | Admitting: Internal Medicine

## 2014-11-09 ENCOUNTER — Inpatient Hospital Stay: Payer: Medicare Other

## 2014-11-09 ENCOUNTER — Telehealth: Payer: Self-pay | Admitting: Oncology

## 2014-11-09 VITALS — BP 122/82 | HR 116 | Temp 98.1°F | Resp 18 | Ht 67.0 in | Wt 251.3 lb

## 2014-11-09 DIAGNOSIS — M109 Gout, unspecified: Secondary | ICD-10-CM

## 2014-11-09 DIAGNOSIS — C439 Malignant melanoma of skin, unspecified: Secondary | ICD-10-CM

## 2014-11-09 DIAGNOSIS — M7989 Other specified soft tissue disorders: Secondary | ICD-10-CM

## 2014-11-09 DIAGNOSIS — Z5111 Encounter for antineoplastic chemotherapy: Secondary | ICD-10-CM | POA: Diagnosis not present

## 2014-11-09 DIAGNOSIS — D3500 Benign neoplasm of unspecified adrenal gland: Secondary | ICD-10-CM

## 2014-11-09 DIAGNOSIS — C7951 Secondary malignant neoplasm of bone: Secondary | ICD-10-CM

## 2014-11-09 DIAGNOSIS — R918 Other nonspecific abnormal finding of lung field: Secondary | ICD-10-CM

## 2014-11-09 DIAGNOSIS — R609 Edema, unspecified: Secondary | ICD-10-CM

## 2014-11-09 DIAGNOSIS — I509 Heart failure, unspecified: Secondary | ICD-10-CM

## 2014-11-09 DIAGNOSIS — I10 Essential (primary) hypertension: Secondary | ICD-10-CM

## 2014-11-09 DIAGNOSIS — R635 Abnormal weight gain: Secondary | ICD-10-CM

## 2014-11-09 DIAGNOSIS — I2510 Atherosclerotic heart disease of native coronary artery without angina pectoris: Secondary | ICD-10-CM

## 2014-11-09 DIAGNOSIS — Z7982 Long term (current) use of aspirin: Secondary | ICD-10-CM

## 2014-11-09 DIAGNOSIS — C7800 Secondary malignant neoplasm of unspecified lung: Secondary | ICD-10-CM

## 2014-11-09 DIAGNOSIS — Z79899 Other long term (current) drug therapy: Secondary | ICD-10-CM

## 2014-11-09 DIAGNOSIS — F17210 Nicotine dependence, cigarettes, uncomplicated: Secondary | ICD-10-CM

## 2014-11-09 DIAGNOSIS — R5383 Other fatigue: Secondary | ICD-10-CM

## 2014-11-09 LAB — COMPREHENSIVE METABOLIC PANEL
ALBUMIN: 3.4 g/dL — AB (ref 3.5–5.0)
ALT: 9 U/L — ABNORMAL LOW (ref 17–63)
ANION GAP: 6 (ref 5–15)
AST: 14 U/L — ABNORMAL LOW (ref 15–41)
Alkaline Phosphatase: 61 U/L (ref 38–126)
BILIRUBIN TOTAL: 0.9 mg/dL (ref 0.3–1.2)
BUN: 13 mg/dL (ref 6–20)
CO2: 26 mmol/L (ref 22–32)
Calcium: 9.5 mg/dL (ref 8.9–10.3)
Chloride: 108 mmol/L (ref 101–111)
Creatinine, Ser: 1.22 mg/dL (ref 0.61–1.24)
GFR calc Af Amer: >60 mL/min (ref >60)
GFR calc non Af Amer: 57 mL/min — ABNORMAL LOW (ref >60)
Glucose, Bld: 108 mg/dL — ABNORMAL HIGH (ref 65–99)
Potassium: 4.3 mmol/L (ref 3.5–5.1)
Sodium: 140 mmol/L (ref 135–145)
TOTAL PROTEIN: 6.5 g/dL (ref 6.5–8.1)

## 2014-11-09 LAB — CBC WITH DIFFERENTIAL/PLATELET
BASOS PCT: 1 %
Basophils Absolute: 0 10*3/uL (ref 0–0.1)
EOS ABS: 0.1 10*3/uL (ref 0–0.7)
Eosinophils Relative: 2 %
HCT: 38.7 % — ABNORMAL LOW (ref 40.0–52.0)
Hemoglobin: 12.6 g/dL — ABNORMAL LOW (ref 13.0–18.0)
Lymphocytes Relative: 17 %
Lymphs Abs: 1.2 10*3/uL (ref 1.0–3.6)
MCH: 28.8 pg (ref 26.0–34.0)
MCHC: 32.5 g/dL (ref 32.0–36.0)
MCV: 88.5 fL (ref 80.0–100.0)
MONO ABS: 0.8 10*3/uL (ref 0.2–1.0)
Monocytes Relative: 11 %
Neutro Abs: 5 10*3/uL (ref 1.4–6.5)
Neutrophils Relative %: 69 %
Platelets: 153 10*3/uL (ref 150–440)
RBC: 4.38 MIL/uL — ABNORMAL LOW (ref 4.40–5.90)
RDW: 16.7 % — ABNORMAL HIGH (ref 11.5–14.5)
WBC: 7.1 10*3/uL (ref 3.8–10.6)

## 2014-11-09 MED ORDER — SODIUM CHLORIDE 0.9 % IV SOLN
300.0000 mg | Freq: Once | INTRAVENOUS | Status: AC
  Administered 2014-11-09: 300 mg via INTRAVENOUS
  Filled 2014-11-09: qty 30

## 2014-11-09 MED ORDER — SODIUM CHLORIDE 0.9 % IV SOLN
Freq: Once | INTRAVENOUS | Status: AC
  Administered 2014-11-09: 11:00:00 via INTRAVENOUS
  Filled 2014-11-09: qty 1000

## 2014-11-09 NOTE — Progress Notes (Signed)
[place]  [phone]) [phone] Fax:(336) [phone]  ID: Eddie Elliott OB: 05/07/1940  MR#: [id number]  WHQ#:[id number]  Patient Care Team: Juluis Pitch, MD as PCP - General (Family Medicine)  CHIEF COMPLAINT:  Chief Complaint  Patient presents with  . Follow-up    Melanoma  . Chemotherapy    Nivolumab     INTERVAL HISTORY:  Patient returns for oncology f/u, seen by me in absence of Dr.Finnegan. States he is doing steady, states that he has chronic fatigue. Otherwise eating well, states he has gained weight. States that lower extremity swelling is mildly better.Denies any further GI bleeding. No chest pain, shortness of breath, cough, or hemoptysis. No nausea, vomiting.  REVIEW OF SYSTEMS:   Review of Systems  Constitutional: Negative.   Respiratory: Negative.   Cardiovascular: Positive for leg swelling.  Gastrointestinal: Negative for diarrhea.   No fever or chills. No new headaches imbalance or falls.  PAST MEDICAL HISTORY: Past Medical History  Diagnosis Date  . 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 Hypertension   . Gout   . CHF (congestive heart failure)     PAST SURGICAL HISTORY: Past Surgical History  Procedure Laterality Date  . Appendectomy    . Partial hip arthroplasty Right     FAMILY HISTORY: Reviewed and unchanged. No reported history of malignancy or chronic disease.  HEALTH MAINTENANCE: Social History  Substance Use Topics  . Smoking status: Current Every Day Smoker -- 1.00 packs/day    Types: Cigarettes  . Smokeless tobacco: Never Used  . Alcohol Use: No    Allergies  Allergen Reactions  . No Known Allergies     Current Outpatient Prescriptions  Medication Sig Dispense Refill  . ALPRAZolam (XANAX) 0.25 MG tablet Take 1 tablet (0.25 mg total) by mouth 2 (two) times daily as needed for anxiety. 45 tablet 0  . aspirin EC 81 MG  tablet Take by mouth.    . carvedilol (COREG) 3.125 MG tablet Take by mouth.    . furosemide (LASIX) 20 MG tablet Take 20 mg by mouth every other day.    Marland Kitchen HYDROcodone-acetaminophen (NORCO/VICODIN) 5-325 MG per tablet Take 1 tablet by mouth every 6 (six) hours as needed for moderate pain. 120 tablet 0  . prochlorperazine (COMPAZINE) 10 MG tablet Take 1 tablet (10 mg total) by mouth every 6 (six) hours as needed for nausea or vomiting. 120 tablet 1  . propafenone (RYTHMOL) 300 MG tablet Take 300 mg by mouth 2 (two) times daily.  0  . tamsulosin (FLOMAX) 0.4 MG CAPS capsule TAKE ONE CAPSULE BY MOUTH ONCE A DAY 30 MINUTES AFTER SAME MEAL EACH DAY    . furosemide (LASIX) 20 MG tablet Take 1 tablet (20 mg total) by mouth daily. TAKE 1 TABLET (20 MG TOTAL) BY MOUTH ONCE a day to twice a day, PRN. (Patient not taking: Reported on 11/09/2014) 60 tablet 1  . predniSONE (DELTASONE) 10 MG tablet Take 4 tabs (41m) for 4 days, then 2 tab (286m for 4 days, then 1 tab (107mfor 4 days, then 1/2 tab (5mg67mor 4 days, then 1/4 tab for 4 days (2.5mg)74men stop. (Patient not taking: Reported on 11/09/2014) 32 tablet 0   No current facility-administered medications for this visit.    OBJECTIVE: Filed Vitals:   11/09/14 1012  BP: 122/82  Pulse: 116  Temp: 98.1 F (36.7 C)  Resp: 18     Body mass index is 39.35 kg/(m^2).      GENERAL: Patient is alert and oriented, sitting in wheelchair  and in no acute distress. There is no icterus. HEENT: Extraocular movements intact. No cervical lymphadenopathy. CVS: S1S2, regular. LUNGS: Bilaterally clear to auscultation. No crepitations. ABDOMEN: Soft, nontender.  EXTREMITIES: b/l 1+ pedal edema.  LAB RESULTS: Lab Results  Component Value Date   NA 140 11/09/2014   K 4.3 11/09/2014   CL 108 11/09/2014   CO2 26 11/09/2014   GLUCOSE 108* 11/09/2014   BUN 13 11/09/2014   CREATININE 1.22 11/09/2014   CALCIUM 9.5 11/09/2014   PROT 6.5 11/09/2014   ALBUMIN 3.4*  11/09/2014   AST 14* 11/09/2014   ALT 9* 11/09/2014   ALKPHOS 61 11/09/2014   BILITOT 0.9 11/09/2014   GFRNONAA 57* 11/09/2014   GFRAA >60 11/09/2014    Lab Results  Component Value Date   WBC 7.1 11/09/2014   NEUTROABS 5.0 11/09/2014   HGB 12.6* 11/09/2014   HCT 38.7* 11/09/2014   MCV 88.5 11/09/2014   PLT 153 11/09/2014     ASSESSMENT: 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  PLAN:   1. Melanoma: Reviewed labs from today. Patient clinically is doing steady. Plan is to continue with next planned dose of Nivolumab today. Per Dr. Grayland Ormond, patient needs restaging after this dose, therefore request restaging PET scan and get him to meet with Dr. Grayland Ormond next Monday to make further plan of management since patient is planning to leave for [place] after that.    2. GI bleed: Patient reports no further bleeding. Plan per Dr. Grayland Ormond. 3. Anemia: Mild, hemoglobin just below normal, monitor. 4. Peripheral edema: Continue Lasix as prescribed. 5. Diarrhea: Better, plan per Dr. Grayland Ormond.  In between visits, the patient has been advised to call or come to the ER in case of fevers, bleeding, acute sickness or new symptoms. Patient agreeable to this plan.   Leia Alf, MD   11/09/2014 10:49 AM

## 2014-11-09 NOTE — Progress Notes (Signed)
   11/09/14 1100  Clinical Encounter Type  Visited With Patient and family together  Visit Type Follow-up  Referral From Nurse  Consult/Referral To Chaplain  Provided pastoral support and presence to patient and his wife in the cancer center. [place] [phone]

## 2014-11-09 NOTE — Progress Notes (Signed)
Patient is here for follow-up and treatment. Patient states that he just got back from [place] on Wednesday. He has been coughing more since he has returned. He has also been really fatigued since returning.

## 2014-11-13 ENCOUNTER — Ambulatory Visit
Admission: RE | Admit: 2014-11-13 | Discharge: 2014-11-13 | Disposition: A | Discharge status: Home / Self Care | Payer: Medicare Other | Source: Ambulatory Visit | Attending: Internal Medicine | Admitting: Internal Medicine

## 2014-11-13 DIAGNOSIS — C7951 Secondary malignant neoplasm of bone: Secondary | ICD-10-CM | POA: Diagnosis not present

## 2014-11-13 DIAGNOSIS — C439 Malignant melanoma of skin, unspecified: Secondary | ICD-10-CM | POA: Diagnosis not present

## 2014-11-13 DIAGNOSIS — C7800 Secondary malignant neoplasm of unspecified lung: Secondary | ICD-10-CM | POA: Diagnosis not present

## 2014-11-13 LAB — GLUCOSE, CAPILLARY: GLUCOSE-CAPILLARY: 83 mg/dL (ref 65–99)

## 2014-11-13 MED ORDER — FLUDEOXYGLUCOSE F - 18 (FDG) INJECTION
12.5100 | Freq: Once | INTRAVENOUS | Status: DC | PRN
  Administered 2014-11-13: 12.51 via INTRAVENOUS
  Filled 2014-11-13: qty 12.51

## 2014-11-16 ENCOUNTER — Inpatient Hospital Stay (HOSPITAL_BASED_OUTPATIENT_CLINIC_OR_DEPARTMENT_OTHER): Payer: Medicare Other | Admitting: Oncology

## 2014-11-16 VITALS — BP 89/65 | HR 84 | Temp 95.4°F | Resp 18

## 2014-11-16 DIAGNOSIS — F17210 Nicotine dependence, cigarettes, uncomplicated: Secondary | ICD-10-CM

## 2014-11-16 DIAGNOSIS — C7951 Secondary malignant neoplasm of bone: Secondary | ICD-10-CM | POA: Diagnosis not present

## 2014-11-16 DIAGNOSIS — C7800 Secondary malignant neoplasm of unspecified lung: Secondary | ICD-10-CM | POA: Diagnosis not present

## 2014-11-16 DIAGNOSIS — C439 Malignant melanoma of skin, unspecified: Secondary | ICD-10-CM | POA: Diagnosis not present

## 2014-11-16 DIAGNOSIS — D3500 Benign neoplasm of unspecified adrenal gland: Secondary | ICD-10-CM

## 2014-11-16 DIAGNOSIS — R635 Abnormal weight gain: Secondary | ICD-10-CM

## 2014-11-16 DIAGNOSIS — Z79899 Other long term (current) drug therapy: Secondary | ICD-10-CM

## 2014-11-16 DIAGNOSIS — M109 Gout, unspecified: Secondary | ICD-10-CM

## 2014-11-16 DIAGNOSIS — I10 Essential (primary) hypertension: Secondary | ICD-10-CM

## 2014-11-16 DIAGNOSIS — M7989 Other specified soft tissue disorders: Secondary | ICD-10-CM

## 2014-11-16 DIAGNOSIS — R609 Edema, unspecified: Secondary | ICD-10-CM

## 2014-11-16 DIAGNOSIS — R5383 Other fatigue: Secondary | ICD-10-CM

## 2014-11-16 DIAGNOSIS — I2510 Atherosclerotic heart disease of native coronary artery without angina pectoris: Secondary | ICD-10-CM

## 2014-11-16 DIAGNOSIS — I509 Heart failure, unspecified: Secondary | ICD-10-CM

## 2014-11-16 DIAGNOSIS — R918 Other nonspecific abnormal finding of lung field: Secondary | ICD-10-CM

## 2014-11-16 DIAGNOSIS — Z5111 Encounter for antineoplastic chemotherapy: Secondary | ICD-10-CM | POA: Diagnosis not present

## 2014-11-16 DIAGNOSIS — Z7982 Long term (current) use of aspirin: Secondary | ICD-10-CM

## 2014-11-16 NOTE — Progress Notes (Signed)
Patient has an increase in his fatigue and sleeping a lot.

## 2014-11-20 NOTE — Progress Notes (Signed)
[place]  Telephone:(336) [phone] Fax:(336) [phone]  ID: Bebe Liter OB: 1940/04/19  MR#: [id number]  QQI#:[id number]  Patient Care Team: Juluis Pitch, MD as PCP - General (Family Medicine)  CHIEF COMPLAINT:  Chief Complaint  Patient presents with  . Follow-up    Melanoma     INTERVAL HISTORY: Patient returns to clinic today for further evaluation and discussion of his PET scan results. He continues to have lower extremity edema, but this is improved.  He has no neurologic complaints. He denies any further GI bleeding. He denies any chest pain, shortness of breath, cough, or hemoptysis. He denies any fevers or weight loss. He denies any nausea, vomiting, or constipation. He has no urinary complaints. Patient offers no further specific complaints today.   REVIEW OF SYSTEMS:   Review of Systems  Constitutional: Negative.   Respiratory: Negative.   Cardiovascular: Positive for leg swelling.  Gastrointestinal: Negative for diarrhea.    As per HPI. Otherwise, a complete review of systems is negatve.  PAST MEDICAL HISTORY: Past Medical History  Diagnosis Date  . 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 Hypertension   . Gout   . CHF (congestive heart failure)     PAST SURGICAL HISTORY: Past Surgical History  Procedure Laterality Date  . Appendectomy    . Partial hip arthroplasty Right     FAMILY HISTORY: Reviewed and unchanged. No reported history of malignancy or chronic disease.     ADVANCED DIRECTIVES:    HEALTH MAINTENANCE: Social History  Substance Use Topics  . Smoking status: Current Every Day Smoker -- 1.00 packs/day    Types: Cigarettes  . Smokeless tobacco: Never Used  . Alcohol Use: No     Colonoscopy:  PAP:  Bone density:  Lipid panel:  Allergies  Allergen Reactions  . No Known Allergies     Current Outpatient Prescriptions    Medication Sig Dispense Refill  . ALPRAZolam (XANAX) 0.25 MG tablet Take 1 tablet (0.25 mg total) by mouth 2 (two) times daily as needed for anxiety. 45 tablet 0  . aspirin EC 81 MG tablet Take by mouth.    . carvedilol (COREG) 3.125 MG tablet Take by mouth.    . furosemide (LASIX) 20 MG tablet Take 1 tablet (20 mg total) by mouth daily. TAKE 1 TABLET (20 MG TOTAL) BY MOUTH ONCE a day to twice a day, PRN. 60 tablet 1  . HYDROcodone-acetaminophen (NORCO/VICODIN) 5-325 MG per tablet Take 1 tablet by mouth every 6 (six) hours as needed for moderate pain. 120 tablet 0  . prochlorperazine (COMPAZINE) 10 MG tablet Take 1 tablet (10 mg total) by mouth every 6 (six) hours as needed for nausea or vomiting. 120 tablet 1  . propafenone (RYTHMOL) 300 MG tablet Take 300 mg by mouth 2 (two) times daily.  0  . tamsulosin (FLOMAX) 0.4 MG CAPS capsule TAKE ONE CAPSULE BY MOUTH ONCE A DAY 30 MINUTES AFTER SAME MEAL EACH DAY     No current facility-administered medications for this visit.    OBJECTIVE: Filed Vitals:   11/16/14 1217  BP: 89/65  Pulse: 84  Temp: 95.4 F (35.2 C)  Resp: 18     There is no weight on file to calculate BMI.    ECOG FS:0 - Asymptomatic  General: Well-developed, well-nourished, no acute distress. Eyes: anicteric sclera. Lungs: Clear to auscultation bilaterally. Heart: Regular rate and  rhythm. No rubs, murmurs, or gallops. Abdomen: Soft, nontender, nondistended. No organomegaly noted, normoactive bowel sounds. Musculoskeletal: 1+ bilateral lower extremity edema. Neuro: Alert, answering all questions appropriately. Cranial nerves grossly intact. Skin: No rashes or petechiae noted. Psych: Normal affect.   LAB RESULTS:  Lab Results  Component Value Date   NA 140 11/09/2014   K 4.3 11/09/2014   CL 108 11/09/2014   CO2 26 11/09/2014   GLUCOSE 108* 11/09/2014   BUN 13 11/09/2014   CREATININE 1.22 11/09/2014   CALCIUM 9.5 11/09/2014   PROT 6.5 11/09/2014   ALBUMIN 3.4*  11/09/2014   AST 14* 11/09/2014   ALT 9* 11/09/2014   ALKPHOS 61 11/09/2014   BILITOT 0.9 11/09/2014   GFRNONAA 57* 11/09/2014   GFRAA >60 11/09/2014    Lab Results  Component Value Date   WBC 7.1 11/09/2014   NEUTROABS 5.0 11/09/2014   HGB 12.6* 11/09/2014   HCT 38.7* 11/09/2014   MCV 88.5 11/09/2014   PLT 153 11/09/2014     STUDIES: Nm Pet Image Restage (ps) Whole Body  11/13/2014   CLINICAL DATA:  Subsequent treatment strategy for melanoma  EXAM: NUCLEAR MEDICINE PET WHOLE BODY  TECHNIQUE: 12.51 mCi F-18 FDG was injected intravenously. Full-ring PET imaging was performed from the vertex to the feet after the radiotracer. CT data was obtained and used for attenuation correction and anatomic localization.  FASTING BLOOD GLUCOSE:  Value:  83 mg/dl  COMPARISON:  None.  FINDINGS: Head/Neck: No hypermetabolic lymph nodes in the neck.  Chest: 1.4 x 1.3 cm left upper lobe pulmonary nodule (series 4/ image 108), max SUV 1.8, previously 1.5 x 1.4 cm with max SUV 2.0.  New pulmonary nodules. No focal consolidation. Trace right pleural effusion. No pneumothorax.  Cardiomegaly. No pericardial effusion. Coronary atherosclerosis. Atherosclerotic calcifications of the aortic arch.  2.4 x 1.8 cm left superior mediastinal/prevascular node (series 4/image 106), max SUV 4.2, previously 2.8 x 2.0 cm with max SUV 3.4.  Additional small mediastinal lymph nodes, including a 11 mm right paratracheal node and a 10 mm short axis subcarinal node, non FDG avid.  Abdomen/Pelvis: No abnormal hypermetabolic activity within the liver, pancreas, adrenal glands, or spleen.  2.9 cm right adrenal nodule, non FDG avid, compatible with a benign adrenal adenoma. Cholecystectomy. Dystrophic calcifications in the prostate  No hypermetabolic lymph nodes in the abdomen or pelvis.  Skeleton: Stable sclerotic/destructive changes involving the left L5 vertebral body (series 4/image 27), now with max SUV 4.2, which is beneath the level of  heterogeneous background elsewhere in the thoracolumbar spine (up to max SUV 6.2).  Right hip arthroplasty.  Degenerative changes of the visualized thoracolumbar spine.  Extremities: No hypermetabolic activity to suggest metastasis.  IMPRESSION: 1.4 cm left upper lobe pulmonary metastasis, mildly decreased.  2.4 x 1.8 cm left superior mediastinal/prevascular node, stable versus mildly decreased.  Osseous metastasis involving the left L5 vertebral body, no longer FDG avid.   Electronically Signed   By: Julian Hy M.D.   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  PLAN:    1. Melanoma: PET results from November 13, 2014 were reviewed independently with essentially stable disease. Patient also has now completed XRT to the lesion at his L5 vertebrae. Proceed with Nivolumab asked week.  He will also receive Zometa every other treatment. Return to clinic in October 3 for consideration of his next infusion of maintenance Nivolumab. Patient will be moving  back to [place] therefore is requesting treatments every 4 weeks. His future treatments will be scheduled on October 31, he will have a repeat PET scan on December 5 with follow-up and treatment on December 6.  2. GI bleed: Patient reports no further bleeding. He will likely need a local gastroenterologist for continued follow-up. No biopsies were taken during his EGD in [place] to determine whether this was unusual site of metastatic disease. 3. Anemia: Patient's hemoglobin is now within normal limits. 4. Peripheral edema: Continue Lasix as prescribed. 5. Diarrhea: Steroids have been tapered off and consider Remicade in the future if it recurs.  6. Weight gain: Likely secondary to prednisone. Monitor.  Patient expressed understanding and was in agreement with this plan. He also understands that He can call clinic at any time with any questions, concerns, or complaints.    Melanoma of skin   Staging form: Melanoma of the Skin, AJCC 7th Edition     Clinical stage from 08/30/2014: Stage IV [place], NX, M1c) - Signed by Lloyd Huger, MD on 08/30/2014   Lloyd Huger, MD   11/20/2014 2:34 PM

## 2014-11-23 ENCOUNTER — Inpatient Hospital Stay: Payer: Medicare Other

## 2014-11-24 ENCOUNTER — Other Ambulatory Visit: Payer: Self-pay | Admitting: *Deleted

## 2014-11-24 MED ORDER — FUROSEMIDE 20 MG PO TABS: 20.0000 mg | ORAL_TABLET | Freq: Every day | ORAL | Status: AC

## 2014-11-25 ENCOUNTER — Inpatient Hospital Stay: Payer: Medicare Other

## 2014-11-25 DIAGNOSIS — Z5111 Encounter for antineoplastic chemotherapy: Secondary | ICD-10-CM | POA: Diagnosis not present

## 2014-11-25 DIAGNOSIS — C439 Malignant melanoma of skin, unspecified: Secondary | ICD-10-CM

## 2014-11-25 LAB — COMPREHENSIVE METABOLIC PANEL
ALT: 10 U/L — AB (ref 17–63)
AST: 20 U/L (ref 15–41)
Albumin: 3.4 g/dL — ABNORMAL LOW (ref 3.5–5.0)
Alkaline Phosphatase: 70 U/L (ref 38–126)
Anion gap: 8 (ref 5–15)
BUN: 30 mg/dL — AB (ref 6–20)
CHLORIDE: 106 mmol/L (ref 101–111)
CO2: 24 mmol/L (ref 22–32)
CREATININE: 1.64 mg/dL — AB (ref 0.61–1.24)
Calcium: 8.6 mg/dL — ABNORMAL LOW (ref 8.9–10.3)
GFR, EST AFRICAN AMERICAN: 46 mL/min — AB (ref >60)
GFR, EST NON AFRICAN AMERICAN: 40 mL/min — AB (ref >60)
Glucose, Bld: 112 mg/dL — ABNORMAL HIGH (ref 65–99)
POTASSIUM: 4.1 mmol/L (ref 3.5–5.1)
SODIUM: 138 mmol/L (ref 135–145)
Total Bilirubin: 1.3 mg/dL — ABNORMAL HIGH (ref 0.3–1.2)
Total Protein: 6.7 g/dL (ref 6.5–8.1)

## 2014-11-25 LAB — CBC WITH DIFFERENTIAL/PLATELET
BASOS ABS: 0.1 10*3/uL (ref 0–0.1)
Basophils Relative: 1 %
EOS ABS: 0.3 10*3/uL (ref 0–0.7)
EOS PCT: 4 %
HCT: 40.7 % (ref 40.0–52.0)
Hemoglobin: 13.2 g/dL (ref 13.0–18.0)
Lymphocytes Relative: 19 %
Lymphs Abs: 1.3 10*3/uL (ref 1.0–3.6)
MCH: 28.1 pg (ref 26.0–34.0)
MCHC: 32.4 g/dL (ref 32.0–36.0)
MCV: 86.6 fL (ref 80.0–100.0)
MONO ABS: 0.8 10*3/uL (ref 0.2–1.0)
Monocytes Relative: 11 %
Neutro Abs: 4.5 10*3/uL (ref 1.4–6.5)
Neutrophils Relative %: 65 %
PLATELETS: 177 10*3/uL (ref 150–440)
RBC: 4.7 MIL/uL (ref 4.40–5.90)
RDW: 16.6 % — AB (ref 11.5–14.5)
WBC: 6.9 10*3/uL (ref 3.8–10.6)

## 2014-11-25 MED ORDER — SODIUM CHLORIDE 0.9 % IV SOLN
3.0000 mg/kg | Freq: Once | INTRAVENOUS | Status: AC
  Administered 2014-11-25: 310 mg via INTRAVENOUS
  Filled 2014-11-25: qty 9

## 2014-11-25 MED ORDER — SODIUM CHLORIDE 0.9 % IV SOLN
Freq: Once | INTRAVENOUS | Status: AC
  Administered 2014-11-25: 10:00:00 via INTRAVENOUS
  Filled 2014-11-25: qty 1000

## 2014-11-25 MED ORDER — SODIUM CHLORIDE 0.9 % IJ SOLN
10.0000 mL | INTRAMUSCULAR | Status: DC | PRN
  Filled 2014-11-25: qty 10

## 2014-11-25 NOTE — Progress Notes (Signed)
Patients Creat level is 1.64  Treatment plan indicates it should not be above 1.5  Dr. Grayland Ormond contacted and per verbal order over the phone patient may proceed with treatmen

## 2014-12-01 NOTE — Telephone Encounter (Signed)
Error....tt

## 2014-12-21 ENCOUNTER — Telehealth: Payer: Self-pay | Admitting: *Deleted

## 2014-12-21 ENCOUNTER — Telehealth: Payer: Self-pay

## 2014-12-21 NOTE — Telephone Encounter (Signed)
Dr. Grayland Ormond received call this weekend to report change in patients condition while patient was in [place], called Butch Penny to speak with her about patient this morning and she stated she is currently in class will attempt to call Dr. Grayland Ormond this afternoon.

## 2014-12-21 NOTE — Telephone Encounter (Signed)
Patient is currently in [place] and was admitted in hospital there for 6 days.  He is feeling very weak with vision changes and his wife is very concerned so she is having him air lifted from [place] to a local airport, probably [place], where he will then be transported via EMS to hospital.  She was asking if Dr. Grayland Ormond can direct admit him but that is not possible so she will have him transported to the ER here at [facility].

## 2014-12-22 ENCOUNTER — Emergency Department: Payer: Medicare Other

## 2014-12-22 ENCOUNTER — Other Ambulatory Visit: Payer: Self-pay

## 2014-12-22 ENCOUNTER — Inpatient Hospital Stay
Admission: EM | Admit: 2014-12-22 | Discharge: 2014-12-30 | DRG: 194 | Disposition: A | Discharge status: Skilled Nursing Facility | Payer: Medicare Other | Attending: Internal Medicine | Admitting: Internal Medicine

## 2014-12-22 ENCOUNTER — Encounter: Payer: Self-pay | Admitting: Emergency Medicine

## 2014-12-22 DIAGNOSIS — Z96641 Presence of right artificial hip joint: Secondary | ICD-10-CM | POA: Diagnosis present

## 2014-12-22 DIAGNOSIS — C434 Malignant melanoma of scalp and neck: Secondary | ICD-10-CM | POA: Diagnosis present

## 2014-12-22 DIAGNOSIS — N3090 Cystitis, unspecified without hematuria: Secondary | ICD-10-CM | POA: Diagnosis present

## 2014-12-22 DIAGNOSIS — R0600 Dyspnea, unspecified: Secondary | ICD-10-CM

## 2014-12-22 DIAGNOSIS — L539 Erythematous condition, unspecified: Secondary | ICD-10-CM

## 2014-12-22 DIAGNOSIS — J918 Pleural effusion in other conditions classified elsewhere: Secondary | ICD-10-CM | POA: Diagnosis present

## 2014-12-22 DIAGNOSIS — R609 Edema, unspecified: Secondary | ICD-10-CM

## 2014-12-22 DIAGNOSIS — I482 Chronic atrial fibrillation: Secondary | ICD-10-CM | POA: Diagnosis present

## 2014-12-22 DIAGNOSIS — J189 Pneumonia, unspecified organism: Principal | ICD-10-CM | POA: Diagnosis present

## 2014-12-22 DIAGNOSIS — R52 Pain, unspecified: Secondary | ICD-10-CM

## 2014-12-22 DIAGNOSIS — Z923 Personal history of irradiation: Secondary | ICD-10-CM

## 2014-12-22 DIAGNOSIS — I5032 Chronic diastolic (congestive) heart failure: Secondary | ICD-10-CM | POA: Diagnosis present

## 2014-12-22 DIAGNOSIS — N289 Disorder of kidney and ureter, unspecified: Secondary | ICD-10-CM | POA: Diagnosis present

## 2014-12-22 DIAGNOSIS — Z79899 Other long term (current) drug therapy: Secondary | ICD-10-CM

## 2014-12-22 DIAGNOSIS — C7951 Secondary malignant neoplasm of bone: Secondary | ICD-10-CM | POA: Diagnosis present

## 2014-12-22 DIAGNOSIS — I472 Ventricular tachycardia: Secondary | ICD-10-CM | POA: Diagnosis present

## 2014-12-22 DIAGNOSIS — Z049 Encounter for examination and observation for unspecified reason: Secondary | ICD-10-CM

## 2014-12-22 DIAGNOSIS — I82403 Acute embolism and thrombosis of unspecified deep veins of lower extremity, bilateral: Secondary | ICD-10-CM

## 2014-12-22 DIAGNOSIS — R197 Diarrhea, unspecified: Secondary | ICD-10-CM | POA: Diagnosis not present

## 2014-12-22 DIAGNOSIS — Z7952 Long term (current) use of systemic steroids: Secondary | ICD-10-CM

## 2014-12-22 DIAGNOSIS — C7800 Secondary malignant neoplasm of unspecified lung: Secondary | ICD-10-CM | POA: Diagnosis present

## 2014-12-22 DIAGNOSIS — Z7982 Long term (current) use of aspirin: Secondary | ICD-10-CM

## 2014-12-22 DIAGNOSIS — J90 Pleural effusion, not elsewhere classified: Secondary | ICD-10-CM

## 2014-12-22 DIAGNOSIS — M79604 Pain in right leg: Secondary | ICD-10-CM

## 2014-12-22 DIAGNOSIS — M1A9XX0 Chronic gout, unspecified, without tophus (tophi): Secondary | ICD-10-CM | POA: Diagnosis present

## 2014-12-22 DIAGNOSIS — F17210 Nicotine dependence, cigarettes, uncomplicated: Secondary | ICD-10-CM | POA: Diagnosis present

## 2014-12-22 DIAGNOSIS — D6869 Other thrombophilia: Secondary | ICD-10-CM | POA: Diagnosis present

## 2014-12-22 DIAGNOSIS — M79605 Pain in left leg: Secondary | ICD-10-CM

## 2014-12-22 DIAGNOSIS — M10062 Idiopathic gout, left knee: Secondary | ICD-10-CM | POA: Diagnosis present

## 2014-12-22 DIAGNOSIS — I110 Hypertensive heart disease with heart failure: Secondary | ICD-10-CM | POA: Diagnosis present

## 2014-12-22 DIAGNOSIS — Y95 Nosocomial condition: Secondary | ICD-10-CM | POA: Diagnosis present

## 2014-12-22 DIAGNOSIS — M25562 Pain in left knee: Secondary | ICD-10-CM

## 2014-12-22 DIAGNOSIS — C439 Malignant melanoma of skin, unspecified: Secondary | ICD-10-CM

## 2014-12-22 DIAGNOSIS — R262 Difficulty in walking, not elsewhere classified: Secondary | ICD-10-CM | POA: Diagnosis present

## 2014-12-22 DIAGNOSIS — K219 Gastro-esophageal reflux disease without esophagitis: Secondary | ICD-10-CM | POA: Diagnosis present

## 2014-12-22 DIAGNOSIS — F419 Anxiety disorder, unspecified: Secondary | ICD-10-CM | POA: Diagnosis present

## 2014-12-22 DIAGNOSIS — Z9889 Other specified postprocedural states: Secondary | ICD-10-CM

## 2014-12-22 DIAGNOSIS — N400 Enlarged prostate without lower urinary tract symptoms: Secondary | ICD-10-CM | POA: Diagnosis present

## 2014-12-22 DIAGNOSIS — J181 Lobar pneumonia, unspecified organism: Secondary | ICD-10-CM

## 2014-12-22 LAB — URINALYSIS COMPLETE WITH MICROSCOPIC (ARMC ONLY)
Bilirubin Urine: NEGATIVE
GLUCOSE, UA: NEGATIVE mg/dL
Ketones, ur: NEGATIVE mg/dL
Nitrite: NEGATIVE
PROTEIN: 30 mg/dL — AB
SPECIFIC GRAVITY, URINE: 1.018 (ref 1.005–1.030)
pH: 5 (ref 5.0–8.0)

## 2014-12-22 LAB — COMPREHENSIVE METABOLIC PANEL
ALT: 11 U/L — AB (ref 17–63)
AST: 24 U/L (ref 15–41)
Albumin: 2.9 g/dL — ABNORMAL LOW (ref 3.5–5.0)
Alkaline Phosphatase: 52 U/L (ref 38–126)
Anion gap: 9 (ref 5–15)
BUN: 22 mg/dL — AB (ref 6–20)
CHLORIDE: 97 mmol/L — AB (ref 101–111)
CO2: 31 mmol/L (ref 22–32)
CREATININE: 1.3 mg/dL — AB (ref 0.61–1.24)
Calcium: 8.1 mg/dL — ABNORMAL LOW (ref 8.9–10.3)
GFR calc Af Amer: >60 mL/min (ref >60)
GFR, EST NON AFRICAN AMERICAN: 52 mL/min — AB (ref >60)
Glucose, Bld: 89 mg/dL (ref 65–99)
Potassium: 3.9 mmol/L (ref 3.5–5.1)
Sodium: 137 mmol/L (ref 135–145)
Total Bilirubin: 1.1 mg/dL (ref 0.3–1.2)
Total Protein: 6.5 g/dL (ref 6.5–8.1)

## 2014-12-22 LAB — CBC WITH DIFFERENTIAL/PLATELET
Basophils Absolute: 0.1 10*3/uL (ref 0–0.1)
Basophils Relative: 1 %
Eosinophils Absolute: 0.3 10*3/uL (ref 0–0.7)
Eosinophils Relative: 4 %
HEMATOCRIT: 43.4 % (ref 40.0–52.0)
Hemoglobin: 13.6 g/dL (ref 13.0–18.0)
LYMPHS PCT: 16 %
Lymphs Abs: 1.4 10*3/uL (ref 1.0–3.6)
MCH: 27.2 pg (ref 26.0–34.0)
MCHC: 31.4 g/dL — AB (ref 32.0–36.0)
MCV: 86.7 fL (ref 80.0–100.0)
MONO ABS: 1.6 10*3/uL — AB (ref 0.2–1.0)
MONOS PCT: 18 %
NEUTROS ABS: 5.4 10*3/uL (ref 1.4–6.5)
Neutrophils Relative %: 61 %
Platelets: 163 10*3/uL (ref 150–440)
RBC: 5 MIL/uL (ref 4.40–5.90)
RDW: 18 % — AB (ref 11.5–14.5)
WBC: 8.8 10*3/uL (ref 3.8–10.6)

## 2014-12-22 LAB — LIPASE, BLOOD: LIPASE: 29 U/L (ref 22–51)

## 2014-12-22 LAB — PROTIME-INR
INR: 1.39
Prothrombin Time: 17.3 seconds — ABNORMAL HIGH (ref 11.4–15.0)

## 2014-12-22 LAB — TROPONIN I: Troponin I: <0.03 ng/mL (ref <0.031)

## 2014-12-22 LAB — APTT: aPTT: 32 seconds (ref 24–36)

## 2014-12-22 LAB — LACTIC ACID, PLASMA: LACTIC ACID, VENOUS: 1.7 mmol/L (ref 0.5–2.0)

## 2014-12-22 MED ORDER — DEXTROSE 5 % IV SOLN: 2.0000 g | Freq: Once | INTRAVENOUS | Status: DC

## 2014-12-22 MED ORDER — SODIUM CHLORIDE 0.9 % IV BOLUS (SEPSIS)
1000.0000 mL | Freq: Once | INTRAVENOUS | Status: AC
  Administered 2014-12-23: 1000 mL via INTRAVENOUS

## 2014-12-22 MED ORDER — LEVOFLOXACIN IN D5W 750 MG/150ML IV SOLN
750.0000 mg | Freq: Once | INTRAVENOUS | Status: AC
  Administered 2014-12-22: 750 mg via INTRAVENOUS
  Filled 2014-12-22: qty 150

## 2014-12-22 MED ORDER — SODIUM CHLORIDE 0.9 % IV BOLUS (SEPSIS)
1000.0000 mL | Freq: Once | INTRAVENOUS | Status: AC
  Administered 2014-12-22: 1000 mL via INTRAVENOUS

## 2014-12-22 NOTE — ED Notes (Signed)
IV insertion attempted x2 unsuccessfully.  

## 2014-12-22 NOTE — ED Provider Notes (Signed)
[facility] Emergency Department Provider Note  ____________________________________________  Time seen: 38  I have reviewed the triage vital signs and the nursing notes.   HISTORY  Chief Complaint Shortness of Breath    HPI Eddie Elliott is a 74 y.o. male reports a history of lung cancer and is receiving immunotherapy under the care of Dr. Grayland Ormond. He reports that several days ago he was flying to [place] when he became short of breath on the airplane. He also reports that he was having pain in the right knee. Given nasal cannula oxygen which improved his symptoms during the flight, however even after the flight he was still short of breath. He went to a hospital in [place] where he was found to be tachycardic and hypoxic and treated for pneumonia. He then reached out to Dr. Grayland Ormond and arranged for a jet ICU to transfer him back to [facility] to come to this hospital for admission and further care. Denies chest pain fevers chills syncope.     Past Medical History  Diagnosis Date  . 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 Hypertension   . Gout   . CHF (congestive heart failure)      Patient Active Problem List   Diagnosis Date Noted  . Melanoma of skin 08/30/2014     Past Surgical History  Procedure Laterality Date  . Appendectomy    . Partial hip arthroplasty Right      Current Outpatient Rx  Name  Route  Sig  Dispense  Refill  . ALPRAZolam (XANAX) 0.25 MG tablet   Oral   Take 1 tablet (0.25 mg total) by mouth 2 (two) times daily as needed for anxiety.   45 tablet   0   . aspirin EC 81 MG tablet   Oral   Take by mouth.         . carvedilol (COREG) 3.125 MG tablet   Oral   Take by mouth.         . furosemide (LASIX) 20 MG tablet   Oral   Take 1 tablet (20 mg total) by mouth daily. TAKE 1 TABLET (20 MG TOTAL) BY MOUTH ONCE a day to twice a day,  PRN.   60 tablet   1   . HYDROcodone-acetaminophen (NORCO/VICODIN) 5-325 MG per tablet   Oral   Take 1 tablet by mouth every 6 (six) hours as needed for moderate pain.   120 tablet   0   . prochlorperazine (COMPAZINE) 10 MG tablet   Oral   Take 1 tablet (10 mg total) by mouth every 6 (six) hours as needed for nausea or vomiting.   120 tablet   1   . propafenone (RYTHMOL) 300 MG tablet   Oral   Take 300 mg by mouth 2 (two) times daily.      0   . tamsulosin (FLOMAX) 0.4 MG CAPS capsule      TAKE ONE CAPSULE BY MOUTH ONCE A DAY 30 MINUTES AFTER SAME MEAL EACH DAY            Allergies No known allergies   History reviewed. No pertinent family history.  Social History Social History  Substance Use Topics  . Smoking status: Current Every Day Smoker -- 1.00 packs/day    Types: Cigarettes  . Smokeless tobacco: Never Used  . Alcohol Use: No    Review of Systems  Constitutional:   No fever or chills. No weight changes Eyes:   No blurry vision or double vision.  ENT:   No sore throat. Cardiovascular:   No chest pain. Respiratory:   Positive shortness of breath, nonproductive cough Gastrointestinal:   Negative for abdominal pain, vomiting and diarrhea.  No BRBPR or melena. Genitourinary:   Negative for dysuria, urinary retention, bloody urine, or difficulty urinating. Musculoskeletal:   Right knee pain. No swelling. Skin:   Negative for rash. Neurological:   Negative for headaches, focal weakness or numbness. Psychiatric:  No anxiety or depression.   Endocrine:  No hot/cold intolerance, changes in energy, or sleep difficulty.  10-point ROS otherwise negative.  ____________________________________________   PHYSICAL EXAM:  VITAL SIGNS: ED Triage Vitals  Enc Vitals Group     BP 12/22/14 2130 95/65 mmHg     Pulse Rate 12/22/14 2130 92     Resp 12/22/14 2136 16     Temp 12/22/14 2136 97.9 F (36.6 C)     Temp Source 12/22/14 2136 Oral     SpO2 12/22/14  2130 100 %     Weight --      Height --      Head Cir --      Peak Flow --      Pain Score 12/22/14 2151 10     Pain Loc --      Pain Edu? --      Excl. in [place]? --      Constitutional:   Alert and oriented. Well appearing and in no distress. Morbidly obese Eyes:   No scleral icterus. No conjunctival pallor. PERRL. EOMI ENT   Head:   Normocephalic and atraumatic.   Nose:   No congestion/rhinnorhea. No septal hematoma   Mouth/Throat:   Dry mucous membranes, no pharyngeal erythema. No peritonsillar mass. No uvula shift.   Neck:   No stridor. No SubQ emphysema. No meningismus. Hematological/Lymphatic/Immunilogical:   No cervical lymphadenopathy. Cardiovascular:   RRR. Normal and symmetric distal pulses are present in all extremities. No murmurs, rubs, or gallops. Respiratory:   Normal respiratory effort without tachypnea nor retractions. Diminished breath sounds at right base Gastrointestinal:   Soft and nontender. Large pannus. No distention. There is no CVA tenderness.  No rebound, rigidity, or guarding. Genitourinary:   deferred Musculoskeletal:   Nontender with normal range of motion in all extremities. No joint effusions.  No lower extremity tenderness.  Symmetric trace edema bilateral lower extremities. Neurologic:   Normal speech and language.  CN 2-10 normal. Motor grossly intact. No gross focal neurologic deficits are appreciated.  Skin:    Skin is warm, dry and intact. No rash noted.  No petechiae, purpura, or bullae. Psychiatric:   Mood and affect are normal. Speech and behavior are normal. Patient exhibits appropriate insight and judgment.  ____________________________________________    LABS (pertinent positives/negatives) (all labs ordered are listed, but only abnormal results are displayed) Labs Reviewed  COMPREHENSIVE METABOLIC PANEL - Abnormal; Notable for the following:    Chloride 97 (*)    BUN 22 (*)    Creatinine, Ser 1.30 (*)    Calcium 8.1 (*)     Albumin 2.9 (*)    ALT 11 (*)    GFR calc non Af Amer 52 (*)    All other components within normal limits  CBC WITH DIFFERENTIAL/PLATELET - Abnormal; Notable for the following:    MCHC 31.4 (*)    RDW 18.0 (*)    Monocytes Absolute 1.6 (*)  All other components within normal limits  PROTIME-INR - Abnormal; Notable for the following:    Prothrombin Time 17.3 (*)    All other components within normal limits  URINALYSIS COMPLETEWITH MICROSCOPIC ([facility] ONLY) - Abnormal; Notable for the following:    Color, Urine YELLOW (*)    APPearance HAZY (*)    Hgb urine dipstick 2+ (*)    Protein, ur 30 (*)    Leukocytes, UA 2+ (*)    Bacteria, UA RARE (*)    Squamous Epithelial / LPF 6-30 (*)    All other components within normal limits  CULTURE, BLOOD (ROUTINE X 2)  CULTURE, BLOOD (ROUTINE X 2)  CULTURE, EXPECTORATED SPUTUM-ASSESSMENT  URINE CULTURE  LACTIC ACID, PLASMA  LIPASE, BLOOD  TROPONIN I  APTT  LACTIC ACID, PLASMA   urine white blood cell and red blood cell too numerous to count ____________________________________________   EKG  Interpreted by me Atrial fibrillation with a rate of 96. Normal axis and intervals. Poor R progression in anterior precordial leads. Normal ST segments and T waves.  ____________________________________________    RADIOLOGY  Chest x-ray reveals consolidation of the right lower lobe CT angiogram of the chest pending ____________________________________________   PROCEDURES   ____________________________________________   INITIAL IMPRESSION / ASSESSMENT AND PLAN / ED COURSE  Pertinent labs & imaging results that were available during my care of the patient were reviewed by me and considered in my medical decision making (see chart for details).  Patient presents for shortness of breath in the setting of recent travel and a history of cancer. High risk for pulmonary embolism. He is recently treated for pneumonia and his vital signs  have improved according to her was initially found on the records from the [place] hospital. We will give IV fluids, check labs and urinalysis, given Levaquin IV, and treat any symptoms. Low suspicion for DVT in the right lower extremity.  ----------------------------------------- 12:13 AM on 12/23/2014 -----------------------------------------  Workup reveals consolidation of the right lower lung which is likely pneumonia and causing his shortness of breath. Labs are all unremarkable. His creatinine is 1.3 which is improved from 2.1 that it was in [place]. Due to the high risk for pulmonary embolism, we'll proceed with a CT angios gram of the chest. Patient given Levaquin for pneumonia already, and I ordered ceftriaxone for the urinary tract infection as well. Case discussed with the hospitalist for admission. Case discussed with Dr. Ma Hillock who is on-call for oncology who agrees with admission for Dr. Grayland Ormond to evaluate in the morning.  Follow up with CT Angio signed out to Dr. Owens Shark and Dr. Lavetta Nielsen   ____________________________________________   FINAL CLINICAL IMPRESSION(S) / ED DIAGNOSES  Final diagnoses:  Dyspnea  RLL pneumonia  Cystitis      Carrie Mew, MD 12/23/14 [phone]

## 2014-12-22 NOTE — ED Notes (Addendum)
Pt presents ED via EMS through ICU air from [place] with c/o SOB. Pt states he checked into a hospital in [place] and he was found that his lung cancer has progressive got worse. Pt also was Tx for pneumonia. Pt under Tx with Dr. Grayland Ormond oncology.

## 2014-12-23 ENCOUNTER — Inpatient Hospital Stay: Payer: Medicare Other

## 2014-12-23 DIAGNOSIS — C7800 Secondary malignant neoplasm of unspecified lung: Secondary | ICD-10-CM | POA: Diagnosis present

## 2014-12-23 DIAGNOSIS — N3090 Cystitis, unspecified without hematuria: Secondary | ICD-10-CM | POA: Diagnosis present

## 2014-12-23 DIAGNOSIS — Z96641 Presence of right artificial hip joint: Secondary | ICD-10-CM | POA: Diagnosis present

## 2014-12-23 DIAGNOSIS — R531 Weakness: Secondary | ICD-10-CM

## 2014-12-23 DIAGNOSIS — M10062 Idiopathic gout, left knee: Secondary | ICD-10-CM | POA: Diagnosis present

## 2014-12-23 DIAGNOSIS — I110 Hypertensive heart disease with heart failure: Secondary | ICD-10-CM | POA: Diagnosis present

## 2014-12-23 DIAGNOSIS — C439 Malignant melanoma of skin, unspecified: Secondary | ICD-10-CM | POA: Diagnosis not present

## 2014-12-23 DIAGNOSIS — Y95 Nosocomial condition: Secondary | ICD-10-CM | POA: Diagnosis present

## 2014-12-23 DIAGNOSIS — I482 Chronic atrial fibrillation: Secondary | ICD-10-CM | POA: Diagnosis present

## 2014-12-23 DIAGNOSIS — C434 Malignant melanoma of scalp and neck: Secondary | ICD-10-CM | POA: Diagnosis present

## 2014-12-23 DIAGNOSIS — Z923 Personal history of irradiation: Secondary | ICD-10-CM | POA: Diagnosis not present

## 2014-12-23 DIAGNOSIS — J189 Pneumonia, unspecified organism: Secondary | ICD-10-CM | POA: Diagnosis present

## 2014-12-23 DIAGNOSIS — N289 Disorder of kidney and ureter, unspecified: Secondary | ICD-10-CM | POA: Diagnosis present

## 2014-12-23 DIAGNOSIS — I509 Heart failure, unspecified: Secondary | ICD-10-CM

## 2014-12-23 DIAGNOSIS — R0602 Shortness of breath: Secondary | ICD-10-CM

## 2014-12-23 DIAGNOSIS — D6869 Other thrombophilia: Secondary | ICD-10-CM | POA: Diagnosis present

## 2014-12-23 DIAGNOSIS — R0600 Dyspnea, unspecified: Secondary | ICD-10-CM | POA: Diagnosis present

## 2014-12-23 DIAGNOSIS — C7951 Secondary malignant neoplasm of bone: Secondary | ICD-10-CM

## 2014-12-23 DIAGNOSIS — Z79899 Other long term (current) drug therapy: Secondary | ICD-10-CM | POA: Diagnosis not present

## 2014-12-23 DIAGNOSIS — F17210 Nicotine dependence, cigarettes, uncomplicated: Secondary | ICD-10-CM | POA: Diagnosis present

## 2014-12-23 DIAGNOSIS — Z7952 Long term (current) use of systemic steroids: Secondary | ICD-10-CM | POA: Diagnosis not present

## 2014-12-23 DIAGNOSIS — M79605 Pain in left leg: Secondary | ICD-10-CM

## 2014-12-23 DIAGNOSIS — R5383 Other fatigue: Secondary | ICD-10-CM

## 2014-12-23 DIAGNOSIS — I10 Essential (primary) hypertension: Secondary | ICD-10-CM

## 2014-12-23 DIAGNOSIS — R262 Difficulty in walking, not elsewhere classified: Secondary | ICD-10-CM | POA: Diagnosis present

## 2014-12-23 DIAGNOSIS — I472 Ventricular tachycardia: Secondary | ICD-10-CM | POA: Diagnosis present

## 2014-12-23 DIAGNOSIS — I5032 Chronic diastolic (congestive) heart failure: Secondary | ICD-10-CM | POA: Diagnosis present

## 2014-12-23 DIAGNOSIS — J918 Pleural effusion in other conditions classified elsewhere: Secondary | ICD-10-CM | POA: Diagnosis present

## 2014-12-23 DIAGNOSIS — Z8719 Personal history of other diseases of the digestive system: Secondary | ICD-10-CM

## 2014-12-23 DIAGNOSIS — M1A9XX0 Chronic gout, unspecified, without tophus (tophi): Secondary | ICD-10-CM | POA: Diagnosis present

## 2014-12-23 DIAGNOSIS — Z7982 Long term (current) use of aspirin: Secondary | ICD-10-CM | POA: Diagnosis not present

## 2014-12-23 DIAGNOSIS — F419 Anxiety disorder, unspecified: Secondary | ICD-10-CM | POA: Diagnosis present

## 2014-12-23 DIAGNOSIS — K219 Gastro-esophageal reflux disease without esophagitis: Secondary | ICD-10-CM | POA: Diagnosis present

## 2014-12-23 DIAGNOSIS — R197 Diarrhea, unspecified: Secondary | ICD-10-CM | POA: Diagnosis not present

## 2014-12-23 DIAGNOSIS — M79604 Pain in right leg: Secondary | ICD-10-CM

## 2014-12-23 DIAGNOSIS — N400 Enlarged prostate without lower urinary tract symptoms: Secondary | ICD-10-CM | POA: Diagnosis present

## 2014-12-23 LAB — LACTIC ACID, PLASMA: LACTIC ACID, VENOUS: 1.5 mmol/L (ref 0.5–2.0)

## 2014-12-23 MED ORDER — ASPIRIN EC 81 MG PO TBEC
81.0000 mg | DELAYED_RELEASE_TABLET | Freq: Every day | ORAL | Status: DC
  Administered 2014-12-23: 08:00:00 81 mg via ORAL
  Filled 2014-12-23: qty 1

## 2014-12-23 MED ORDER — TAMSULOSIN HCL 0.4 MG PO CAPS
0.4000 mg | ORAL_CAPSULE | Freq: Every day | ORAL | Status: DC
  Administered 2014-12-23 – 2014-12-30 (×8): 0.4 mg via ORAL
  Filled 2014-12-23 (×8): qty 1

## 2014-12-23 MED ORDER — DEXTROSE 5 % IV SOLN
250.0000 mg | INTRAVENOUS | Status: DC
  Administered 2014-12-23 – 2014-12-24 (×2): 250 mg via INTRAVENOUS
  Filled 2014-12-23 (×3): qty 250

## 2014-12-23 MED ORDER — SODIUM CHLORIDE 0.9 % IJ SOLN
3.0000 mL | Freq: Two times a day (BID) | INTRAMUSCULAR | Status: DC
  Administered 2014-12-23 – 2014-12-30 (×15): 3 mL via INTRAVENOUS

## 2014-12-23 MED ORDER — SODIUM CHLORIDE 0.9 % IV BOLUS (SEPSIS)
500.0000 mL | Freq: Once | INTRAVENOUS | Status: AC
  Administered 2014-12-23: 500 mL via INTRAVENOUS

## 2014-12-23 MED ORDER — NYSTATIN 100000 UNIT/GM EX OINT
TOPICAL_OINTMENT | Freq: Three times a day (TID) | CUTANEOUS | Status: DC
  Administered 2014-12-23 – 2014-12-28 (×17): via TOPICAL
  Administered 2014-12-30: 1 via TOPICAL
  Filled 2014-12-23: qty 15

## 2014-12-23 MED ORDER — ALPRAZOLAM 0.25 MG PO TABS
0.2500 mg | ORAL_TABLET | Freq: Two times a day (BID) | ORAL | Status: DC | PRN
  Administered 2014-12-23 – 2014-12-29 (×2): 0.25 mg via ORAL
  Filled 2014-12-23 (×3): qty 1

## 2014-12-23 MED ORDER — ACETAMINOPHEN 325 MG PO TABS: 650.0000 mg | ORAL_TABLET | Freq: Four times a day (QID) | ORAL | Status: DC | PRN

## 2014-12-23 MED ORDER — DOCUSATE SODIUM 100 MG PO CAPS
100.0000 mg | ORAL_CAPSULE | Freq: Two times a day (BID) | ORAL | Status: DC
Start: 2014-12-23 — End: 2014-12-29
  Administered 2014-12-23 – 2014-12-28 (×11): 100 mg via ORAL
  Filled 2014-12-23 (×13): qty 1

## 2014-12-23 MED ORDER — MORPHINE SULFATE (PF) 2 MG/ML IV SOLN
2.0000 mg | INTRAVENOUS | Status: DC | PRN
  Administered 2014-12-23 – 2014-12-28 (×5): 2 mg via INTRAVENOUS
  Filled 2014-12-23 (×5): qty 1

## 2014-12-23 MED ORDER — DIPHENHYDRAMINE HCL 25 MG PO CAPS
25.0000 mg | ORAL_CAPSULE | ORAL | Status: DC | PRN
  Administered 2014-12-23 – 2014-12-28 (×4): 25 mg via ORAL
  Filled 2014-12-23 (×4): qty 1

## 2014-12-23 MED ORDER — PROPAFENONE HCL 150 MG PO TABS
300.0000 mg | ORAL_TABLET | Freq: Two times a day (BID) | ORAL | Status: DC
  Filled 2014-12-23 (×3): qty 2

## 2014-12-23 MED ORDER — IOHEXOL 350 MG/ML SOLN
100.0000 mL | Freq: Once | INTRAVENOUS | Status: AC | PRN
  Administered 2014-12-23: 06:00:00 100 mL via INTRAVENOUS

## 2014-12-23 MED ORDER — DEXTROSE 5 % IV SOLN
1.0000 g | INTRAVENOUS | Status: DC
  Administered 2014-12-23 – 2014-12-27 (×5): 1 g via INTRAVENOUS
  Filled 2014-12-23 (×6): qty 10

## 2014-12-23 MED ORDER — CARVEDILOL 3.125 MG PO TABS
3.1250 mg | ORAL_TABLET | Freq: Two times a day (BID) | ORAL | Status: DC
  Administered 2014-12-23 – 2014-12-30 (×10): 3.125 mg via ORAL
  Filled 2014-12-23 (×14): qty 1

## 2014-12-23 MED ORDER — FUROSEMIDE 20 MG PO TABS
20.0000 mg | ORAL_TABLET | Freq: Every day | ORAL | Status: DC
  Administered 2014-12-23 – 2014-12-30 (×8): 20 mg via ORAL
  Filled 2014-12-23 (×8): qty 1

## 2014-12-23 MED ORDER — ACETAMINOPHEN 650 MG RE SUPP: 650.0000 mg | Freq: Four times a day (QID) | RECTAL | Status: DC | PRN

## 2014-12-23 MED ORDER — SODIUM CHLORIDE 0.9 % IV SOLN
INTRAVENOUS | Status: DC
  Administered 2014-12-23: 04:00:00 via INTRAVENOUS

## 2014-12-23 MED ORDER — HYDROCODONE-ACETAMINOPHEN 5-325 MG PO TABS
1.0000 | ORAL_TABLET | Freq: Four times a day (QID) | ORAL | Status: DC | PRN
  Administered 2014-12-23 – 2014-12-29 (×8): 1 via ORAL
  Filled 2014-12-23 (×8): qty 1

## 2014-12-23 MED ORDER — HEPARIN SODIUM (PORCINE) 5000 UNIT/ML IJ SOLN
5000.0000 [IU] | Freq: Three times a day (TID) | INTRAMUSCULAR | Status: DC
  Administered 2014-12-23 (×2): 5000 [IU] via SUBCUTANEOUS
  Filled 2014-12-23 (×2): qty 1

## 2014-12-23 MED ORDER — GADOBENATE DIMEGLUMINE 529 MG/ML IV SOLN: 20.0000 mL | Freq: Once | INTRAVENOUS | Status: DC | PRN

## 2014-12-23 NOTE — Progress Notes (Signed)
[facility] [place] NAME: Eddie Elliott    MR#:  [id number]  DATE OF BIRTH:  Dec 29, 1940  SUBJECTIVE:  CHIEF COMPLAINT:   Chief Complaint  Patient presents with  . Shortness of Breath   Patient here due to shortness of breath and also having difficulty walking and complaining of pain on base of both his legs. CT chest showing a moderate to large sided right pleural effusion.    REVIEW OF SYSTEMS:    Review of Systems  Constitutional: Negative for fever and chills.  HENT: Negative for congestion and tinnitus.   Eyes: Negative for blurred vision and double vision.  Respiratory: Positive for shortness of breath. Negative for cough and wheezing.   Cardiovascular: Negative for chest pain, orthopnea and PND.  Gastrointestinal: Negative for nausea, vomiting, abdominal pain and diarrhea.  Genitourinary: Negative for dysuria and hematuria.  Musculoskeletal: Positive for joint pain (left knee).  Neurological: Positive for weakness (generalized). Negative for dizziness, sensory change and focal weakness.  All other systems reviewed and are negative.   Nutrition: Regular Tolerating Diet: Yes Tolerating PT: Await evaluation   DRUG ALLERGIES:   Allergies  Allergen Reactions  . No Known Allergies     VITALS:  Blood pressure 95/66, pulse 103, temperature 98.6 F (37 C), temperature source Oral, resp. rate 22, height 5\' 7"  (1.702 m), weight 113.354 kg (249 lb 14.4 oz), SpO2 100 %.  PHYSICAL EXAMINATION:   Physical Exam  GENERAL:  74 y.o.-year-old obese patient lying in the bed with no acute distress.  EYES: Pupils equal, round, reactive to light and accommodation. No scleral icterus. Extraocular muscles intact.  HEENT: Head atraumatic, normocephalic. Oropharynx and nasopharynx clear.  NECK:  Supple, no jugular venous distention. No thyroid enlargement, no tenderness.  LUNGS: Poor air entry from the right midlung fields down,  no wheezing, rales, rhonchi. No use of accessory muscles of respiration.  CARDIOVASCULAR: S1, S2 normal. No murmurs, rubs, or gallops.  ABDOMEN: Soft, nontender, nondistended. Bowel sounds present. No organomegaly or mass.  EXTREMITIES: No cyanosis, clubbing, +1 pitting edema from the knees to the ankles bilaterally   NEUROLOGIC: Cranial nerves II through XII are intact. No focal Motor or sensory deficits b/l.  Globally weak PSYCHIATRIC: The patient is alert and oriented x 3. Good affect SKIN: No obvious rash, lesion, or ulcer.    LABORATORY PANEL:   CBC  Recent Labs Lab 12/22/14 2140  WBC 8.8  HGB 13.6  HCT 43.4  PLT 163   ------------------------------------------------------------------------------------------------------------------  Chemistries   Recent Labs Lab 12/22/14 2140  NA 137  K 3.9  CL 97*  CO2 31  GLUCOSE 89  BUN 22*  CREATININE 1.30*  CALCIUM 8.1*  AST 24  ALT 11*  ALKPHOS 52  BILITOT 1.1   ------------------------------------------------------------------------------------------------------------------  Cardiac Enzymes  Recent Labs Lab 12/22/14 2140  TROPONINI <0.03   ------------------------------------------------------------------------------------------------------------------  RADIOLOGY:  Ct Angio Chest Pe W/cm &/or Wo Cm  12/23/2014   CLINICAL DATA:  Dyspnea and metastatic melanoma  EXAM: CT ANGIOGRAPHY CHEST WITH CONTRAST  TECHNIQUE: Multidetector CT imaging of the chest was performed using the standard protocol during bolus administration of intravenous contrast. Multiplanar CT image reconstructions and MIPs were obtained to evaluate the vascular anatomy.  CONTRAST:  142mL OMNIPAQUE IOHEXOL 350 MG/ML SOLN  COMPARISON:  11/13/2014 PET-CT  FINDINGS: THORACIC INLET/BODY WALL:  No acute abnormality.  MEDIASTINUM:  Chronic cardiomegaly. No pericardial effusion. Diffuse atherosclerosis, including the coronary arteries. No evidence  of pulmonary  embolism. No acute aortic finding.  Stable left superior mediastinal nodule, 24 mm in maximal diameter.  LUNG WINDOWS:  Moderate to large right pleural effusion, approximately 40% of the right chest volume, with multi segment atelectasis. No indication or pneumonia.  Stable 14 mm left upper lobe metastasis. No new metastatic focus identified. No pleural nodularity.  UPPER ABDOMEN:  Nonspecific haziness of fat around the hepatoduodenal ligament, similar to 11/13/2014 study. Noted negative liver function tests. The patient is status post cholecystectomy.  3 cm right adrenal nodule, previously characterized as adenoma.  OSSEOUS:  No acute fracture.  No suspicious lytic or blastic lesions.  Review of the MIP images confirms the above findings.  IMPRESSION: 1. No evidence of pulmonary embolism. 2. Moderate to large right pleural effusion, significantly increased from 11/13/2014 PET, with multi segment atelectasis. 3. Stable mediastinal and left upper lobe metastases.   Electronically Signed   By: Monte Fantasia M.D.   On: 12/23/2014 07:01   Dg Chest Port 1 View  12/22/2014   CLINICAL DATA:  Patient has lung cancer with shortness of breath.  EXAM: PORTABLE CHEST 1 VIEW  COMPARISON:  May 31, 2014  FINDINGS: The heart size is enlarged. The mediastinal contour is stable. There is consolidation of the right mid to lung base. The left lung is clear. No acute abnormalities identified within the visualized bones.  IMPRESSION: Consolidation of the right mid to lung base.   Electronically Signed   By: Abelardo Diesel M.D.   On: 12/22/2014 22:31     ASSESSMENT AND PLAN:   74 year old male with past medical history of GI bleed, stage IV melanoma, history of gout, history of CHF, hypertension who presented to the hospital due to shortness of breath and having difficulty walking.  #1 shortness of breath-most likely cause of patient's worsening respiratory status is due to the right-sided pleural effusion which was seen on  the CT scan of the chest. There was no evidence of pulmonary embolism. ?? Underlying pneumonia and continue empiric antibiotics with Levaquin. -Continue oxygen support and supportive care for now. -I will get a ultrasound guided thoracentesis tomorrow. Patient is a high likelihood to have a malignant effusion given his history of melanoma. I will get fluid studies for cytology.  #2 back/knee pain and difficulty walking-etiology unclear. Discussed with patient's oncologist Dr. Grayland Ormond and this is not the patient's baseline. We will get MRI of his lumbar spine and also brain to rule out any metastatic disease given his history of melanoma. -Continue supportive care with pain control. I will get a x-ray of his left knee. No evidence of acute gout attack based on presentation and examination.  #3 history of CHF-clinically patient is not in congestive heart failure. -Continue Coreg, Lasix  #4 BPH-continue Flomax.  #5 anxiety-continue as needed Xanax.    All the records are reviewed and case discussed with Care Management/Social Workerr. Management plans discussed with the patient, family and they are in agreement.  CODE STATUS: Full  DVT Prophylaxis: Teds and SCDs  TOTAL TIME TAKING CARE OF THIS PATIENT: 30 minutes.   POSSIBLE D/C IN 2-3 DAYS, DEPENDING ON CLINICAL CONDITION.   Henreitta Leber M.D on 12/23/2014 at 1:52 PM  Between 7am to 6pm - Pager - [phone]  After 6pm go to [email or web address] - password [email or web address] [facility]  Office  [phone]  CC: Primary care physician; Juluis Pitch, MD

## 2014-12-23 NOTE — ED Notes (Addendum)
Attempted x 2 to place U/S guided PIV per MD request. Flash obtained on both attempts; adequate blood return noted from partially inserted catheter, however would not advance or flush. MD and primary nurse made aware.

## 2014-12-23 NOTE — Consult Note (Signed)
[place]  [phone] Fax:(336) [phone]  ID: Eddie Elliott OB: 11-15-1940  MR#: [id number]  XIP#:[id number]  Patient Care Team: Juluis Pitch, MD as PCP - General (Family Medicine)  CHIEF COMPLAINT:  Chief Complaint  Patient presents with  . Shortness of Breath    INTERVAL HISTORY: Patient is a 74 year old male who is undergoing treatment for stage IV melanoma who while at home in [place] developed severe lower extremity pain and was unable to walk. Patient states he felt mildly short of breath on his flight to [place], but had no other symptoms. He denies any fevers. He has a fair appetite and denies weight loss. He denies any chest pain. He denies any other pain. He denies any nausea, vomiting, constipation, or diarrhea. He has no urinary complaints. Patient feels generally terrible, but offers no further specific complaints.  REVIEW OF SYSTEMS:   Review of Systems  Constitutional: Positive for malaise/fatigue. Negative for fever.  Respiratory: Positive for shortness of breath.   Cardiovascular: Negative.   Gastrointestinal: Negative.   Musculoskeletal:       Bilateral leg pain.  Neurological: Positive for weakness.  Psychiatric/Behavioral: Negative.     As per HPI. Otherwise, a complete review of systems is negatve.  PAST MEDICAL HISTORY: Past Medical History  Diagnosis Date  . 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 Hypertension   . Gout   . CHF (congestive heart failure)     PAST SURGICAL HISTORY: Past Surgical History  Procedure Laterality Date  . Appendectomy    . Partial hip arthroplasty Right     FAMILY HISTORY History reviewed. No pertinent family history.     ADVANCED DIRECTIVES:    HEALTH MAINTENANCE: Social History  Substance Use Topics  . Smoking status: Current Every Day Smoker -- 1.00 packs/day    Types: Cigarettes  . Smokeless  tobacco: Never Used  . Alcohol Use: No     Colonoscopy:  PAP:  Bone density:  Lipid panel:  Allergies  Allergen Reactions  . No Known Allergies     Current Facility-Administered Medications  Medication Dose Route Frequency Provider Last Rate Last Dose  . 0.9 %  sodium chloride infusion   Intravenous Continuous Harrie Foreman, MD 75 mL/hr at 12/23/14 0355    . acetaminophen (TYLENOL) tablet 650 mg  650 mg Oral Q6H PRN Harrie Foreman, MD       Or  . acetaminophen (TYLENOL) suppository 650 mg  650 mg Rectal Q6H PRN Harrie Foreman, MD      . ALPRAZolam Duanne Moron) tablet 0.25 mg  0.25 mg Oral BID PRN Harrie Foreman, MD      . aspirin EC tablet 81 mg  81 mg Oral Daily Harrie Foreman, MD   81 mg at 12/23/14 6734  . carvedilol (COREG) tablet 3.125 mg  3.125 mg Oral BID WC Harrie Foreman, MD   3.125 mg at 12/23/14 0827  . diphenhydrAMINE (BENADRYL) capsule 25 mg  25 mg Oral Q4H PRN Harrie Foreman, MD   25 mg at 12/23/14 0502  . docusate sodium (COLACE) capsule 100 mg  100 mg Oral BID Harrie Foreman, MD   100 mg at 12/23/14 0827  . furosemide (LASIX) tablet 20 mg  20 mg Oral Daily Harrie Foreman, MD   20 mg at 12/23/14 0827  . heparin injection 5,000 Units  5,000 Units  Subcutaneous 3 times per day Harrie Foreman, MD   5,000 Units at 12/23/14 0502  . HYDROcodone-acetaminophen (NORCO/VICODIN) 5-325 MG per tablet 1 tablet  1 tablet Oral Q6H PRN Harrie Foreman, MD   1 tablet at 12/23/14 1251  . morphine 2 MG/ML injection 2 mg  2 mg Intravenous Q4H PRN Harrie Foreman, MD      . nystatin ointment (MYCOSTATIN)   Topical TID Harrie Foreman, MD      . propafenone [facility]) tablet 300 mg  300 mg Oral BID Harrie Foreman, MD   300 mg at 12/23/14 1000  . sodium chloride 0.9 % injection 3 mL  3 mL Intravenous Q12H Harrie Foreman, MD   3 mL at 12/23/14 0345  . tamsulosin (FLOMAX) capsule 0.4 mg  0.4 mg Oral Daily Harrie Foreman, MD   0.4 mg at 12/23/14 0827     OBJECTIVE: Filed Vitals:   12/23/14 0420  BP: 95/66  Pulse: 103  Temp: 98.6 F (37 C)  Resp: 22     Body mass index is 39.13 kg/(m^2).    ECOG FS:3 - Symptomatic, >50% confined to bed  General: Ill-appearing, moderate distress secondary to pain. Eyes: Pink conjunctiva, anicteric sclera. Lungs: Clear to auscultation bilaterally. Heart: Regular rate and rhythm. No rubs, murmurs, or gallops. Abdomen: Soft, nontender, nondistended. No organomegaly noted, normoactive bowel sounds. Musculoskeletal: No edema, cyanosis, or clubbing. Neuro: Alert, answering all questions appropriately. Cranial nerves grossly intact. Skin: No rashes or petechiae noted. Psych: Normal affect.   LAB RESULTS:  Lab Results  Component Value Date   NA 137 12/22/2014   K 3.9 12/22/2014   CL 97* 12/22/2014   CO2 31 12/22/2014   GLUCOSE 89 12/22/2014   BUN 22* 12/22/2014   CREATININE 1.30* 12/22/2014   CALCIUM 8.1* 12/22/2014   PROT 6.5 12/22/2014   ALBUMIN 2.9* 12/22/2014   AST 24 12/22/2014   ALT 11* 12/22/2014   ALKPHOS 52 12/22/2014   BILITOT 1.1 12/22/2014   GFRNONAA 52* 12/22/2014   GFRAA >60 12/22/2014    Lab Results  Component Value Date   WBC 8.8 12/22/2014   NEUTROABS 5.4 12/22/2014   HGB 13.6 12/22/2014   HCT 43.4 12/22/2014   MCV 86.7 12/22/2014   PLT 163 12/22/2014     STUDIES: Ct Angio Chest Pe W/cm &/or Wo Cm  12/23/2014   CLINICAL DATA:  Dyspnea and metastatic melanoma  EXAM: CT ANGIOGRAPHY CHEST WITH CONTRAST  TECHNIQUE: Multidetector CT imaging of the chest was performed using the standard protocol during bolus administration of intravenous contrast. Multiplanar CT image reconstructions and MIPs were obtained to evaluate the vascular anatomy.  CONTRAST:  165m OMNIPAQUE IOHEXOL 350 MG/ML SOLN  COMPARISON:  11/13/2014 PET-CT  FINDINGS: THORACIC INLET/BODY WALL:  No acute abnormality.  MEDIASTINUM:  Chronic cardiomegaly. No pericardial effusion. Diffuse atherosclerosis,  including the coronary arteries. No evidence of pulmonary embolism. No acute aortic finding.  Stable left superior mediastinal nodule, 24 mm in maximal diameter.  LUNG WINDOWS:  Moderate to large right pleural effusion, approximately 40% of the right chest volume, with multi segment atelectasis. No indication or pneumonia.  Stable 14 mm left upper lobe metastasis. No new metastatic focus identified. No pleural nodularity.  UPPER ABDOMEN:  Nonspecific haziness of fat around the hepatoduodenal ligament, similar to 11/13/2014 study. Noted negative liver function tests. The patient is status post cholecystectomy.  3 cm right adrenal nodule, previously characterized as adenoma.  OSSEOUS:  No acute fracture.  No suspicious lytic or blastic lesions.  Review of the MIP images confirms the above findings.  IMPRESSION: 1. No evidence of pulmonary embolism. 2. Moderate to large right pleural effusion, significantly increased from 11/13/2014 PET, with multi segment atelectasis. 3. Stable mediastinal and left upper lobe metastases.   Electronically Signed   By: Monte Fantasia M.D.   On: 12/23/2014 07:01   Dg Chest Port 1 View  12/22/2014   CLINICAL DATA:  Patient has lung cancer with shortness of breath.  EXAM: PORTABLE CHEST 1 VIEW  COMPARISON:  May 31, 2014  FINDINGS: The heart size is enlarged. The mediastinal contour is stable. There is consolidation of the right mid to lung base. The left lung is clear. No acute abnormalities identified within the visualized bones.  IMPRESSION: Consolidation of the right mid to lung base.   Electronically Signed   By: Abelardo Diesel M.D.   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 Severe leg pain.  PLAN:    1. Melanoma: PET results from November 13, 2014 were reviewed independently and reported essentially stable disease. Patient also has now completed XRT to the lesion at his L5 vertebrae. Patient is  receiving Nivolumab approximately every 4 weeks since he lives in [place] in between treatments. His next scheduled treatment is on October 3. 2. Leg pain: Unclear etiology. We will get MRI of the brain as well as MRI of his lumbar spine for further evaluation and assess for metastatic disease. 3. H/o GI bleed: Patient reports no further bleeding. He will likely need a local gastroenterologist for continued follow-up. No biopsies were taken during his EGD in [place] to determine whether this was unusual site of metastatic disease. 4. Shortness breath: CT scan results reviewed independently without any evidence of pulmonary embolism.  5. Pneumonia: Continue current antibiotics  Appreciate consult, will follow.  Melanoma of skin   Staging form: Melanoma of the Skin, AJCC 7th Edition     Clinical stage from 08/30/2014: Stage IV ([place], NX, M1c) - Signed by Lloyd Huger, MD on 08/30/2014   Lloyd Huger, MD   12/23/2014 1:19 PM

## 2014-12-23 NOTE — Progress Notes (Signed)
   12/23/14 1330  Clinical Encounter Type  Visited With Patient  Visit Type Follow-up  Provided pastoral presence and support to patient on unit.  [place] [phone]

## 2014-12-23 NOTE — Plan of Care (Signed)
Problem: Discharge Progression Outcomes Goal: Discharge plan in place and appropriate Individualization of care Likes to be called Eddie Elliott. Travel to [place] often. On moderate fall precautions per policy. Has history of gout, hypertension and CHF, controlled by medications.    Goal: Other Discharge Outcomes/Goals Admitted for pneumonia IV fluids continued. Benadryl given for itching with improvement. No complaints of pain. Will continue to monitor.

## 2014-12-23 NOTE — H&P (Signed)
Eddie Elliott is an 74 y.o. male.    Chief Complaint: Weakness HPI: The patient with PMH significant for metastatic melanoma to the lung presents to the ED complaining of weakness.  The patient lives in [place] but receives his chemotherapy here.  He was admitted to the hospital aborad and diagnosed with pneumonia.  He called his oncologist who recommended returning to the [place] for further treatment.  Upon arrival he admitted to shortness of breath and leg pain.  The ED ordered CTA of the chest to rule out pulmonary embolus but had been able to establish an iv to push contrast by the time of my interview.  Nonetheless, the patient had improved with supplement oxygen.  Due to his complicated medical history the emergency department staff called for admission.   Past Medical History  Diagnosis Date  . 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 Hypertension   . Gout   . CHF (congestive heart failure)     Past Surgical History  Procedure Laterality Date  . Appendectomy    . Partial hip arthroplasty Right     History reviewed. No pertinent family history. Social History:  reports that he has been smoking Cigarettes.  He has been smoking about 1.00 pack per day. He has never used smokeless tobacco. He reports that he does not drink alcohol or use illicit drugs.  Allergies:  Allergies  Allergen Reactions  . No Known Allergies     Medications Prior to Admission  Medication Sig Dispense Refill  . ALPRAZolam (XANAX) 0.25 MG tablet Take 1 tablet (0.25 mg total) by mouth 2 (two) times daily as needed for anxiety. 45 tablet 0  . aspirin EC 81 MG tablet Take by mouth.    . carvedilol (COREG) 3.125 MG tablet Take 3.125 mg by mouth 2 (two) times daily with a meal.     . furosemide (LASIX) 20 MG tablet Take 1 tablet (20 mg total) by mouth daily. TAKE 1 TABLET (20 MG TOTAL) BY MOUTH ONCE a day to twice a day, PRN. 60 tablet 1   . HYDROcodone-acetaminophen (NORCO/VICODIN) 5-325 MG per tablet Take 1 tablet by mouth every 6 (six) hours as needed for moderate pain. 120 tablet 0  . propafenone (RYTHMOL) 300 MG tablet Take 300 mg by mouth 2 (two) times daily.  0  . tamsulosin (FLOMAX) 0.4 MG CAPS capsule TAKE ONE CAPSULE BY MOUTH ONCE A DAY 30 MINUTES AFTER SAME MEAL EACH DAY    . prochlorperazine (COMPAZINE) 10 MG tablet Take 1 tablet (10 mg total) by mouth every 6 (six) hours as needed for nausea or vomiting. 120 tablet 1    Results for orders placed or performed during the hospital encounter of 12/22/14 (from the past 48 hour(s))  Lactic acid, plasma     Status: None   Collection Time: 12/22/14  9:40 PM  Result Value Ref Range   Lactic Acid, Venous 1.7 0.5 - 2.0 mmol/L  Comprehensive metabolic panel     Status: Abnormal   Collection Time: 12/22/14  9:40 PM  Result Value Ref Range   Sodium 137 135 - 145 mmol/L   Potassium 3.9 3.5 - 5.1 mmol/L   Chloride 97 (L) 101 - 111 mmol/L   CO2 31 22 - 32 mmol/L   Glucose, Bld 89 65 - 99 mg/dL   BUN 22 (H) 6 - 20 mg/dL   Creatinine, Ser 1.30 (  H) 0.61 - 1.24 mg/dL   Calcium 8.1 (L) 8.9 - 10.3 mg/dL   Total Protein 6.5 6.5 - 8.1 g/dL   Albumin 2.9 (L) 3.5 - 5.0 g/dL   AST 24 15 - 41 U/L   ALT 11 (L) 17 - 63 U/L   Alkaline Phosphatase 52 38 - 126 U/L   Total Bilirubin 1.1 0.3 - 1.2 mg/dL   GFR calc non Af Amer 52 (L) >60 mL/min   GFR calc Af Amer >60 >60 mL/min    Comment: (NOTE) The eGFR has been calculated using the CKD EPI equation. This calculation has not been validated in all clinical situations. eGFR's persistently <60 mL/min signify possible Chronic Kidney Disease.    Anion gap 9 5 - 15  Lipase, blood     Status: None   Collection Time: 12/22/14  9:40 PM  Result Value Ref Range   Lipase 29 22 - 51 U/L  Troponin I     Status: None   Collection Time: 12/22/14  9:40 PM  Result Value Ref Range   Troponin I <0.03 <0.031 ng/mL    Comment:        NO INDICATION  OF MYOCARDIAL INJURY.   CBC WITH DIFFERENTIAL     Status: Abnormal   Collection Time: 12/22/14  9:40 PM  Result Value Ref Range   WBC 8.8 3.8 - 10.6 K/uL   RBC 5.00 4.40 - 5.90 MIL/uL   Hemoglobin 13.6 13.0 - 18.0 g/dL   HCT 43.4 40.0 - 52.0 %   MCV 86.7 80.0 - 100.0 fL   MCH 27.2 26.0 - 34.0 pg   MCHC 31.4 (L) 32.0 - 36.0 g/dL   RDW 18.0 (H) 11.5 - 14.5 %   Platelets 163 150 - 440 K/uL   Neutrophils Relative % 61 %   Neutro Abs 5.4 1.4 - 6.5 K/uL   Lymphocytes Relative 16 %   Lymphs Abs 1.4 1.0 - 3.6 K/uL   Monocytes Relative 18 %   Monocytes Absolute 1.6 (H) 0.2 - 1.0 K/uL   Eosinophils Relative 4 %   Eosinophils Absolute 0.3 0 - 0.7 K/uL   Basophils Relative 1 %   Basophils Absolute 0.1 0 - 0.1 K/uL  APTT     Status: None   Collection Time: 12/22/14  9:40 PM  Result Value Ref Range   aPTT 32 24 - 36 seconds  Protime-INR     Status: Abnormal   Collection Time: 12/22/14  9:40 PM  Result Value Ref Range   Prothrombin Time 17.3 (H) 11.4 - 15.0 seconds   INR 1.39   Urinalysis complete, with microscopic ([facility] only)     Status: Abnormal   Collection Time: 12/22/14  9:40 PM  Result Value Ref Range   Color, Urine YELLOW (A) YELLOW   APPearance HAZY (A) CLEAR   Glucose, UA NEGATIVE NEGATIVE mg/dL   Bilirubin Urine NEGATIVE NEGATIVE   Ketones, ur NEGATIVE NEGATIVE mg/dL   Specific Gravity, Urine 1.018 1.005 - 1.030   Hgb urine dipstick 2+ (A) NEGATIVE   pH 5.0 5.0 - 8.0   Protein, ur 30 (A) NEGATIVE mg/dL   Nitrite NEGATIVE NEGATIVE   Leukocytes, UA 2+ (A) NEGATIVE   RBC / HPF TOO NUMEROUS TO COUNT 0 - 5 RBC/hpf   WBC, UA TOO NUMEROUS TO COUNT 0 - 5 WBC/hpf   Bacteria, UA RARE (A) NONE SEEN   Squamous Epithelial / LPF 6-30 (A) NONE SEEN   Mucous PRESENT    Hyaline  Casts, UA PRESENT   Lactic acid, plasma     Status: None   Collection Time: 12/23/14 12:33 AM  Result Value Ref Range   Lactic Acid, Venous 1.5 0.5 - 2.0 mmol/L   Dg Chest Port 1 View  12/22/2014    CLINICAL DATA:  Patient has lung cancer with shortness of breath.  EXAM: PORTABLE CHEST 1 VIEW  COMPARISON:  May 31, 2014  FINDINGS: The heart size is enlarged. The mediastinal contour is stable. There is consolidation of the right mid to lung base. The left lung is clear. No acute abnormalities identified within the visualized bones.  IMPRESSION: Consolidation of the right mid to lung base.   Electronically Signed   By: Abelardo Diesel M.D.   On: 12/22/2014 22:31    Review of Systems  Constitutional: Positive for malaise/fatigue. Negative for fever and chills.  HENT: Negative for sore throat and tinnitus.   Eyes: Negative for blurred vision and redness.  Respiratory: Positive for shortness of breath. Negative for cough.   Cardiovascular: Negative for chest pain, palpitations, orthopnea and PND.  Gastrointestinal: Negative for nausea, vomiting, abdominal pain and diarrhea.  Genitourinary: Negative for dysuria, urgency and frequency.  Musculoskeletal: Negative for myalgias and joint pain.  Skin: Positive for itching and rash.       No lesions  Neurological: Positive for weakness. Negative for speech change and focal weakness.  Endo/Heme/Allergies: Does not bruise/bleed easily.       No temperature intolerance  Psychiatric/Behavioral: Negative for depression and suicidal ideas.    Blood pressure 119/56, pulse 98, temperature 97.9 F (36.6 C), temperature source Oral, resp. rate 18, SpO2 98 %. Physical Exam  Nursing note and vitals reviewed. Constitutional: He is oriented to person, place, and time. He appears well-developed and well-nourished. No distress.  HENT:  Head: Normocephalic and atraumatic.  Mouth/Throat: Oropharynx is clear and moist.  Eyes: Conjunctivae and EOM are normal. Pupils are equal, round, and reactive to light. No scleral icterus.  Neck: Normal range of motion. Neck supple. No JVD present. No tracheal deviation present. No thyromegaly present.  Cardiovascular: Normal  rate, regular rhythm and normal heart sounds.  Exam reveals no gallop and no friction rub.   No murmur heard. Respiratory: Effort normal and breath sounds normal. No respiratory distress.  GI: Soft. Bowel sounds are normal. He exhibits no distension.  Genitourinary:  Deferred  Musculoskeletal: Normal range of motion. He exhibits no edema.  Lymphadenopathy:    He has no cervical adenopathy.  Neurological: He is alert and oriented to person, place, and time. No cranial nerve deficit.  Skin: Skin is warm and dry. Rash noted. No erythema.  Psychiatric: He has a normal mood and affect. His behavior is normal. Judgment and thought content normal.     Assessment/Plan This is a 74 year old Caucasian man admitted for HCAP.  PMH sig for melanoma metastasized to lung.  1. HCAP: Continue ceftriaxone; add Vanc and Azithromycin due to immunocompromised status 2. Hypercoagulable state: secondary to malignancy.  Pt is not a candidate for anticoagulation due to history of history of GI bleed.  3. Afib: reasonably rate controlled; contine propafenone 4. CHF: presumably systolic, contributing to shortness of breath; continue lasix per home regimen as well as carvedilol 5. Melanoma: treatment course per Dr. Grayland Ormond 6. DVT pxs: SCD's 7. GI pxs: none The patient is a full code.  Time spent on admission orders and patient care appromximately 35 minutes  Harrie Foreman 12/23/2014, 4:11 AM

## 2014-12-24 ENCOUNTER — Inpatient Hospital Stay: Payer: Medicare Other

## 2014-12-24 LAB — SYNOVIAL CELL COUNT + DIFF, W/ CRYSTALS
Crystals, Fluid: NONE SEEN
Eosinophils-Synovial: 0 %
Lymphocytes-Synovial Fld: 2 %
Monocyte-Macrophage-Synovial Fluid: 1 %
NEUTROPHIL, SYNOVIAL: 97 %
Other Cells-SYN: 0
WBC, SYNOVIAL: 5048 /mm3 — AB (ref 0–200)

## 2014-12-24 LAB — BODY FLUID CELL COUNT WITH DIFFERENTIAL
EOS FL: 2 %
Lymphs, Fluid: 43 %
Monocyte-Macrophage-Serous Fluid: 52 %
NEUTROPHIL FLUID: 3 %
WBC FLUID: 415 uL

## 2014-12-24 LAB — BASIC METABOLIC PANEL
ANION GAP: 7 (ref 5–15)
BUN: 20 mg/dL (ref 6–20)
CALCIUM: 7.5 mg/dL — AB (ref 8.9–10.3)
CO2: 30 mmol/L (ref 22–32)
Chloride: 103 mmol/L (ref 101–111)
Creatinine, Ser: 1.33 mg/dL — ABNORMAL HIGH (ref 0.61–1.24)
GFR calc Af Amer: 59 mL/min — ABNORMAL LOW (ref >60)
GFR calc non Af Amer: 51 mL/min — ABNORMAL LOW (ref >60)
GLUCOSE: 98 mg/dL (ref 65–99)
Potassium: 3.8 mmol/L (ref 3.5–5.1)
Sodium: 140 mmol/L (ref 135–145)

## 2014-12-24 LAB — GLUCOSE, SEROUS FLUID: Glucose, Fluid: 100 mg/dL

## 2014-12-24 LAB — URINE CULTURE

## 2014-12-24 LAB — LACTATE DEHYDROGENASE, PLEURAL OR PERITONEAL FLUID: LD, Fluid: 109 U/L — ABNORMAL HIGH (ref 3–23)

## 2014-12-24 LAB — URIC ACID: Uric Acid, Serum: 9.6 mg/dL — ABNORMAL HIGH (ref 4.4–7.6)

## 2014-12-24 MED ORDER — COLCHICINE 0.6 MG PO TABS
0.6000 mg | ORAL_TABLET | Freq: Two times a day (BID) | ORAL | Status: DC
  Administered 2014-12-24 – 2014-12-29 (×11): 0.6 mg via ORAL
  Filled 2014-12-24 (×11): qty 1

## 2014-12-24 MED ORDER — ENOXAPARIN SODIUM 40 MG/0.4ML ~~LOC~~ SOLN
40.0000 mg | SUBCUTANEOUS | Status: DC
  Administered 2014-12-24 – 2014-12-30 (×7): 40 mg via SUBCUTANEOUS
  Filled 2014-12-24 (×6): qty 0.4

## 2014-12-24 NOTE — Plan of Care (Signed)
Problem: Discharge Progression Outcomes Goal: Other Discharge Outcomes/Goals Plan of care progress to goal: - Hypotensive this shift, MD notified, 558ml bolus given per MD, BP  Improved. - Morphine given  for pain with improvement. - Scheduled for an thoracentesis today, NPO maintained. Will continue to monitor.

## 2014-12-24 NOTE — Clinical Documentation Improvement (Signed)
Hospitalist   Query 1 of 2 Please document the acuity of the patient's "CHF: presumably systolic, contributing to shortness of breath; continue lasix per home regimen as well as carvedilol".  Acuity:  - Acute  - Chronic  - Acute on Chronic  - Other  - Unable to clinically determine   Query 2 of 2 Please document the type of the patient's "atrial fib":  Type:  - Chronic  - Paroxsymal  - Other  - Unable to clinically determine  (please document your response in the progress notes and not on the query form itself.)   Please exercise your independent, professional judgment when responding. A specific answer is not anticipated or expected.   Thank You, Erling Conte  RN BSN CCDS [phone] [facility] [employer] [facility] [place]

## 2014-12-24 NOTE — Plan of Care (Signed)
Problem: Discharge Progression Outcomes Goal: Other Discharge Outcomes/Goals Outcome: Progressing Plan of care progress to goal for: 1. Discharge Plan:         In Place and Appropriate.         Possible Discharge in 2-3 Days. 2. Pain:         Pain Controlled with Appropriate Interventions. 3. Hemodynamically Stable:        Hypotensive. Patient states, " My Blood Pressure is always low." MD Aware.        Afebrile.        Remains on IV Antibiotics.        Labs Improving.          4. Complications:         No signs/symptoms of complications noted. 5. Diet:         Regular Diet. Tolerating Well. 6. Activity:         Bedrest.

## 2014-12-24 NOTE — Progress Notes (Signed)
[facility] [place] NAME: Eddie Elliott    MR#:  [id number]  DATE OF BIRTH:  April 09, 1940  SUBJECTIVE:  CHIEF COMPLAINT:   Chief Complaint  Patient presents with  . Shortness of Breath   Still complaining of pain in his knees bilaterally. Still having difficulty ambulating. Status post ultrasound-guided thoracentesis with 2.5 L of fluid removed. Shortness of breath improved. Wife at bedside today.  REVIEW OF SYSTEMS:    Review of Systems  Constitutional: Negative for fever and chills.  HENT: Negative for congestion and tinnitus.   Eyes: Negative for blurred vision and double vision.  Respiratory: Positive for shortness of breath (improved). Negative for cough and wheezing.   Cardiovascular: Negative for chest pain, orthopnea and PND.  Gastrointestinal: Negative for nausea, vomiting, abdominal pain and diarrhea.  Genitourinary: Negative for dysuria and hematuria.  Musculoskeletal: Positive for joint pain (left knee).  Neurological: Positive for weakness (generalized). Negative for dizziness, sensory change and focal weakness.  All other systems reviewed and are negative.   Nutrition: Regular Tolerating Diet: Yes Tolerating PT: Await evaluation   DRUG ALLERGIES:   Allergies  Allergen Reactions  . No Known Allergies     VITALS:  Blood pressure 97/63, pulse 82, temperature 98.1 F (36.7 C), temperature source Oral, resp. rate 10, height 5\' 7"  (1.702 m), weight 113.354 kg (249 lb 14.4 oz), SpO2 97 %.  PHYSICAL EXAMINATION:   Physical Exam  GENERAL:  74 y.o.-year-old obese patient lying in the bed with no acute distress.  EYES: Pupils equal, round, reactive to light and accommodation. No scleral icterus. Extraocular muscles intact.  HEENT: Head atraumatic, normocephalic. Oropharynx and nasopharynx clear.  NECK:  Supple, no jugular venous distention. No thyroid enlargement, no tenderness.  LUNGS: Good air entry  bilaterally, no wheezing, rales, rhonchi. No use of accessory muscles of respiration.  CARDIOVASCULAR: S1, S2 normal. No murmurs, rubs, or gallops.  ABDOMEN: Soft, nontender, nondistended. Bowel sounds present. No organomegaly or mass.  EXTREMITIES: No cyanosis, clubbing, +1 pitting edema from the knees to the ankles bilaterally.  Bilateral knee swelling,  chronic changes of venous stasis on lower extremity is bilaterally. NEUROLOGIC: Cranial nerves II through XII are intact. No focal Motor or sensory deficits b/l.  Globally weak PSYCHIATRIC: The patient is alert and oriented x 3. Good affect SKIN: No obvious rash, lesion, or ulcer.    LABORATORY PANEL:   CBC  Recent Labs Lab 12/22/14 2140  WBC 8.8  HGB 13.6  HCT 43.4  PLT 163   ------------------------------------------------------------------------------------------------------------------  Chemistries   Recent Labs Lab 12/22/14 2140 12/24/14 0541  NA 137 140  K 3.9 3.8  CL 97* 103  CO2 31 30  GLUCOSE 89 98  BUN 22* 20  CREATININE 1.30* 1.33*  CALCIUM 8.1* 7.5*  AST 24  --   ALT 11*  --   ALKPHOS 52  --   BILITOT 1.1  --    ------------------------------------------------------------------------------------------------------------------  Cardiac Enzymes  Recent Labs Lab 12/22/14 2140  TROPONINI <0.03   ------------------------------------------------------------------------------------------------------------------  RADIOLOGY:  Dg Chest 1 View  12/24/2014   CLINICAL DATA:  Post thoracentesis  EXAM: CHEST 1 VIEW  COMPARISON:  CTA chest dated 12/15/2014  FINDINGS: No pneumothorax is seen.  Small right pleural effusion. Patchy right lower lobe opacity, likely atelectasis.  Cardiomegaly.  IMPRESSION: No pneumothorax is seen.  Small right pleural effusion.  Patchy right lower lobe opacity, likely atelectasis.   Electronically Signed   By: Bertis Ruddy  [place] Pink M.D.   On: 12/24/2014 10:14   Dg Knee 1-2 Views  Left  12/23/2014   CLINICAL DATA:  LEFT knee pain for 1 week. No known injury. Unable to bear weight.  EXAM: LEFT KNEE - 1-2 VIEW  COMPARISON:  None.  FINDINGS: In No fracture of the proximal tibia or distal femur. Patella is normal. Small suprapatellar joint effusion.  IMPRESSION: Small joint effusion.  No fracture or dislocation.   Electronically Signed   By: Suzy Bouchard M.D.   On: 12/23/2014 15:39   Ct Angio Chest Pe W/cm &/or Wo Cm  12/23/2014   CLINICAL DATA:  Dyspnea and metastatic melanoma  EXAM: CT ANGIOGRAPHY CHEST WITH CONTRAST  TECHNIQUE: Multidetector CT imaging of the chest was performed using the standard protocol during bolus administration of intravenous contrast. Multiplanar CT image reconstructions and MIPs were obtained to evaluate the vascular anatomy.  CONTRAST:  18mL OMNIPAQUE IOHEXOL 350 MG/ML SOLN  COMPARISON:  11/13/2014 PET-CT  FINDINGS: THORACIC INLET/BODY WALL:  No acute abnormality.  MEDIASTINUM:  Chronic cardiomegaly. No pericardial effusion. Diffuse atherosclerosis, including the coronary arteries. No evidence of pulmonary embolism. No acute aortic finding.  Stable left superior mediastinal nodule, 24 mm in maximal diameter.  LUNG WINDOWS:  Moderate to large right pleural effusion, approximately 40% of the right chest volume, with multi segment atelectasis. No indication or pneumonia.  Stable 14 mm left upper lobe metastasis. No new metastatic focus identified. No pleural nodularity.  UPPER ABDOMEN:  Nonspecific haziness of fat around the hepatoduodenal ligament, similar to 11/13/2014 study. Noted negative liver function tests. The patient is status post cholecystectomy.  3 cm right adrenal nodule, previously characterized as adenoma.  OSSEOUS:  No acute fracture.  No suspicious lytic or blastic lesions.  Review of the MIP images confirms the above findings.  IMPRESSION: 1. No evidence of pulmonary embolism. 2. Moderate to large right pleural effusion, significantly increased  from 11/13/2014 PET, with multi segment atelectasis. 3. Stable mediastinal and left upper lobe metastases.   Electronically Signed   By: Monte Fantasia M.D.   On: 12/23/2014 07:01   Mr Brain Wo Contrast  12/23/2014   CLINICAL DATA:  74 year old male with metastatic melanoma, 1 week inability to walk and severe lower extremity pain. Subsequent encounter. PET-CT 11/13/2014.  EXAM: MRI HEAD WITHOUT CONTRAST  TECHNIQUE: Multiplanar, multiecho pulse sequences of the brain and surrounding structures were obtained without intravenous contrast.  COMPARISON:  Brain MRI 03/10/2014.  FINDINGS: The examination had to be discontinued prior to completion due to patient back Pain.  Postcontrast imaging was not obtained.  Stable cerebral volume. Major intracranial vascular flow voids are stable. No restricted diffusion to suggest acute infarct. There appears to be T2 shine through in the right centrum semiovale (series 100, image 40). Overall gray and white matter signal appears stable since 2015. No areas of intracranial mass effect or definite vasogenic edema are identified. No acute intracranial hemorrhage identified. No ventriculomegaly. Pituitary, cervicomedullary junction and visualized upper cervical spine appears stable.  New bilateral mastoid effusions. Negative nasopharynx. Paranasal sinuses remain clear. Orbits soft tissues appear normal postoperative changes to the posterior right scalp at the vertex appear stable. Small left lateral scalp benign lipoma incidentally noted.  IMPRESSION: 1. The examination had to be discontinued prior to completion. No postcontrast imaging could be obtained. 2. No definite metastatic disease to the brain or acute intracranial abnormality. 3. New bilateral mastoid effusions, most often postinflammatory.   Electronically Signed   By: Herminio Heads.D.  On: 12/23/2014 15:38   Dg Chest Port 1 View  12/22/2014   CLINICAL DATA:  Patient has lung cancer with shortness of breath.  EXAM:  PORTABLE CHEST 1 VIEW  COMPARISON:  May 31, 2014  FINDINGS: The heart size is enlarged. The mediastinal contour is stable. There is consolidation of the right mid to lung base. The left lung is clear. No acute abnormalities identified within the visualized bones.  IMPRESSION: Consolidation of the right mid to lung base.   Electronically Signed   By: Abelardo Diesel M.D.   On: 12/22/2014 22:31   US Thoracentesis Asp Pleural Space W/img Guide  12/24/2014   CLINICAL DATA:  Recurrent right pleural effusion. Metastatic melanoma.  EXAM: EXAM THORACENTESIS WITH ULTRASOUND  TECHNIQUE: The procedure, risks (including but not limited to bleeding, infection, organ damage ), benefits, and alternatives were explained to the patient. Questions regarding the procedure were encouraged and answered. The patient understands and consents to the procedure.  Survey ultrasound of the right hemithorax was performed and an appropriate skin entry site was localized. Site was marked, prepped with Betadine, draped in usual sterile fashion, infiltrated locally with 1% lidocaine.  The Saf-T-Centesis needle was advanced into the pleural space. Thin clear yellow fluid returned. 2.5 L was removed. Post procedure imaging shows no residual fluid. The patient tolerated procedure well.  COMPLICATIONS: COMPLICATIONS None immediate  IMPRESSION: Technically successful ultrasound-guided right thoracentesis. Follow-up chest radiograph pending.   Electronically Signed   By: Lucrezia Europe M.D.   On: 12/24/2014 10:54     ASSESSMENT AND PLAN:   74 year old male with past medical history of GI bleed, stage IV melanoma, history of gout, history of CHF, hypertension who presented to the hospital due to shortness of breath and having difficulty walking.  #1 shortness of breath-most likely due to the right-sided pleural effusion which was seen on the CT scan of the chest. There was no evidence of pulmonary embolism.  -Possible underlying pneumonia, continue  empiric ceftriaxone/Zithromax. -Status post ultrasound-guided thoracentesis today with 2.5 L of fluid removed. This is thought to be malignant and cytology still pending.  Fluid analysis is right now consistent with a transudative pleural effusion. -Continue oxygen support and supportive care for now. -We'll repeat chest x-ray and an x-ray for 48 hours.  #2 back/knee pain and difficulty walking-x-ray of the left knee did show some swelling but no evidence of acute bony abnormality. -This could possibly be acute gout. We'll start the patient on colchicine empirically. -I have consulted rheumatology for possible arthrocentesis and evaluation. Patient's uric acid is mildly elevated. -We'll likely need a physical therapy evaluation once the acute flareup has improved.  #3 history of chronic diastolic CHF-clinically patient is not in congestive heart failure. -Continue Coreg, Lasix  #4 history of chronic atrial fibrillation-currently rate controlled and continue Coreg. Hold propafenone.  - pt. Not on long term anti-coagulation and will need to discuss with his cardiologist (Dr. Saralyn Pilar)  #5 BPH-continue Flomax.  #6 anxiety-continue as needed Xanax.  #7 history of stage IV melanoma-patient being followed by oncology by Dr. Grayland Ormond. -We will await cytology results of his pleural fluid.  MRI of the brain was negative for any metastatic disease.    All the records are reviewed and case discussed with Care Management/Social Workerr. Management plans discussed with the patient, family and they are in agreement.  CODE STATUS: Full  DVT Prophylaxis: Teds and SCDs  TOTAL TIME TAKING CARE OF THIS PATIENT: 30 minutes.   POSSIBLE D/C IN  2-3 DAYS, DEPENDING ON CLINICAL CONDITION.   Henreitta Leber M.D on 12/24/2014 at 3:13 PM  Between 7am to 6pm - Pager - [phone]  After 6pm go to [email or web address] - password [email or web address] [facility]  Office  [phone]  CC: Primary  care physician; Juluis Pitch, MD

## 2014-12-24 NOTE — Consult Note (Signed)
Reason for Consult: Knee pain  Referring Physician: Hospitalist  Eddie Elliott   HPI: 74 year old white male. History of congestive heart failure. History of metastatic melanoma. On immunotherapy. Long history of gout. Used to drink heavily. Usually would have podagra. Previously prescribed allopurinol but did not take it regularly. Recent swelling in his legs. His been on diarrhetic's. One month ago he had swelling in left wrist. Felt to be gouty. Treated with prednisone taper. 1 week ago he had increased shortness of breath. Had bilateral knee pain worse on the left. Admitted for pleural effusion and shortness of breath. Had thoracentesis. Left knee hurts to move. Doesn't want to ambulate. Hurts to touch. ray showed no significant bony abnormality but effusion. Other joints have not been bothering him. Uric acid 9.6. Creatinine 1.3. CBC normal  PMH: Malignant melanoma. Gouty arthritis. Congestive heart failure. Coronary disease.  SURGICAL HISTORY: Right hip replacement  Family History: Negative for gouty arthritis  Social History: No current alcohol use  Allergies:  Allergies  Allergen Reactions  . No Known Allergies     Medications:  Scheduled: . azithromycin  250 mg Intravenous Q24H  . carvedilol  3.125 mg Oral BID WC  . cefTRIAXone (ROCEPHIN)  IV  1 g Intravenous Q24H  . colchicine  0.6 mg Oral BID  . docusate sodium  100 mg Oral BID  . enoxaparin (LOVENOX) injection  40 mg Subcutaneous Q24H  . furosemide  20 mg Oral Daily  . nystatin ointment   Topical TID  . sodium chloride  3 mL Intravenous Q12H  . tamsulosin  0.4 mg Oral Daily   Continuous:       ROS: Hands have not been swelling. No significant abdominal pain. No significant back pain   PHYSICAL EXAM: Blood pressure 106/63, pulse 106, temperature 98.9 F (37.2 C), temperature source Oral, resp. rate 20, height 5\' 7"  (1.702 m), weight 113.354 kg (249 lb 14.4 oz), SpO2 100 %. Overweight male.  Complains of pain with any motion of the left knee. Initially seen sitting on the side of the bed eating. Sclera clear. Distant lung sounds on the right. No nontender abdomen. Violaceous discoloration of his feet but has distal pulses. 1+ edema. Decreased range of motion of the cervical spine. Shoulders moved well. Right olecranon bursa is swollen with nodule. Left elbow without swelling. Hands without tophi. Hips move reasonably. Left knee with effusion. Holds the knee in contraction. Pain with extension Right knee without significant effusion. Mild disease range of motion of his ankles without pain  Assessment: Significant left knee pain with motion. History of recent gout of the wrists. Hyperuricemia. History of recent and prior gouty arthritis. Suspect all gouty Right olecranon bursitis with nodule. Suspect gouty Pleural effusion. Possibly malignant Metastatic melanoma Congestive heart failure Renal insufficiency  Recommendations:  PROCEDURE: left knee prepped in a sterile fashion  left knee aspirated 5 cc serosanguineous fluid. Injected with 2 cc Xylocaine 2 cc Marcaine and 1 cc Kenalog 40. Sent for microscopy  Agree with colchicine    Discussed resuming allopurinol 100 mg 1 by mouth daily. Could be started as outpatient. Would need to titrate his allopurinol to a uric acid level of 6 or below      [name] 12/24/2014, 5:36 PM

## 2014-12-25 LAB — BASIC METABOLIC PANEL
ANION GAP: 8 (ref 5–15)
BUN: 17 mg/dL (ref 6–20)
CO2: 29 mmol/L (ref 22–32)
Calcium: 7.8 mg/dL — ABNORMAL LOW (ref 8.9–10.3)
Chloride: 104 mmol/L (ref 101–111)
Creatinine, Ser: 1.11 mg/dL (ref 0.61–1.24)
GFR calc Af Amer: >60 mL/min (ref >60)
GLUCOSE: 154 mg/dL — AB (ref 65–99)
POTASSIUM: 4.4 mmol/L (ref 3.5–5.1)
Sodium: 141 mmol/L (ref 135–145)

## 2014-12-25 LAB — CBC
HEMATOCRIT: 37.7 % — AB (ref 40.0–52.0)
HEMOGLOBIN: 12.1 g/dL — AB (ref 13.0–18.0)
MCH: 28 pg (ref 26.0–34.0)
MCHC: 32.1 g/dL (ref 32.0–36.0)
MCV: 87.2 fL (ref 80.0–100.0)
Platelets: 158 10*3/uL (ref 150–440)
RBC: 4.33 MIL/uL — AB (ref 4.40–5.90)
RDW: 18 % — ABNORMAL HIGH (ref 11.5–14.5)
WBC: 7.8 10*3/uL (ref 3.8–10.6)

## 2014-12-25 MED ORDER — AZITHROMYCIN 250 MG PO TABS
250.0000 mg | ORAL_TABLET | Freq: Every day | ORAL | Status: DC
  Administered 2014-12-25 – 2014-12-27 (×3): 250 mg via ORAL
  Filled 2014-12-25 (×3): qty 1

## 2014-12-25 NOTE — Plan of Care (Signed)
Problem: Discharge Progression Outcomes Goal: Other Discharge Outcomes/Goals Outcome: Progressing Plan of care progress to goal for: 1. Discharge Plan:         In Place and Appropriate.         Possible Discharge in 2-3 Days. 2. Pain:         Pain Controlled with Appropriate Interventions. 3. Hemodynamically Stable:        Hypotensive. MD Aware.        Afebrile.        Remains on IV Antibiotics and Oral Antibiotics.             Labs Improving.           4. Complications:         No signs/symptoms of complications noted. 5. Diet:         Regular Diet. Tolerating Well.         Thin Liquids. Tolerating Well. 6. Activity:         Bedrest.

## 2014-12-25 NOTE — Progress Notes (Signed)
[place]  [phone] [phone]  ID: Eddie Elliott OB: [birth year]  MR#: [id number]  ZDG#:[id number]  Patient Care Team: Juluis Pitch, MD as PCP - General (Family Medicine)  CHIEF COMPLAINT:  Chief Complaint  Patient presents with  . Shortness of Breath    INTERVAL HISTORY: Patient's shortness of breath is significantly improved after removal of 2.5 L of fluid. His knee pain is evident, but also improved. He denies any other pain. He continues to have a significantly decreased performance status, but offers no further complaints.  REVIEW OF SYSTEMS:   Review of Systems  Constitutional: Positive for malaise/fatigue. Negative for fever.  Respiratory: Positive for shortness of breath.   Cardiovascular: Negative.   Gastrointestinal: Negative.   Musculoskeletal: Positive for joint pain.  Neurological: Positive for weakness.    As per HPI. Otherwise, a complete review of systems is negatve.  PAST MEDICAL HISTORY: Past Medical History  Diagnosis Date  . 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 Hypertension   . Gout   . CHF (congestive heart failure)     PAST SURGICAL HISTORY: Past Surgical History  Procedure Laterality Date  . Appendectomy    . Partial hip arthroplasty Right     FAMILY HISTORY History reviewed. No pertinent family history.     ADVANCED DIRECTIVES:    HEALTH MAINTENANCE: Social History  Substance Use Topics  . Smoking status: Current Every Day Smoker -- 1.00 packs/day    Types: Cigarettes  . Smokeless tobacco: Never Used  . Alcohol Use: No     Colonoscopy:  PAP:  Bone density:  Lipid panel:  Allergies  Allergen Reactions  . No Known Allergies     Current Facility-Administered Medications  Medication Dose Route Frequency Provider Last Rate Last Dose  . acetaminophen (TYLENOL) tablet 650 mg  650 mg Oral Q6H PRN  Harrie Foreman, MD       Or  . acetaminophen (TYLENOL) suppository 650 mg  650 mg Rectal Q6H PRN Harrie Foreman, MD      . ALPRAZolam Duanne Moron) tablet 0.25 mg  0.25 mg Oral BID PRN Harrie Foreman, MD   0.25 mg at 12/23/14 1356  . azithromycin (ZITHROMAX) tablet 250 mg  250 mg Oral Daily Henreitta Leber, MD      . carvedilol (COREG) tablet 3.125 mg  3.125 mg Oral BID WC Harrie Foreman, MD   3.125 mg at 12/23/14 1637  . cefTRIAXone (ROCEPHIN) 1 g in dextrose 5 % 50 mL IVPB  1 g Intravenous Q24H Henreitta Leber, MD   1 g at 12/25/14 1438  . colchicine tablet 0.6 mg  0.6 mg Oral BID Henreitta Leber, MD   0.6 mg at 12/25/14 0947  . diphenhydrAMINE (BENADRYL) capsule 25 mg  25 mg Oral Q4H PRN Harrie Foreman, MD   25 mg at 12/23/14 0502  . docusate sodium (COLACE) capsule 100 mg  100 mg Oral BID Harrie Foreman, MD   100 mg at 12/25/14 0947  . enoxaparin (LOVENOX) injection 40 mg  40 mg Subcutaneous Q24H Henreitta Leber, MD   40 mg at 12/25/14 1130  . furosemide (LASIX) tablet 20 mg  20 mg Oral Daily Harrie Foreman, MD   20 mg at 12/25/14 0947  . gadobenate dimeglumine (MULTIHANCE) injection 20 mL  20 mL Intravenous Once PRN Medication Radiologist, MD      .  HYDROcodone-acetaminophen (NORCO/VICODIN) 5-325 MG per tablet 1 tablet  1 tablet Oral Q6H PRN Harrie Foreman, MD   1 tablet at 12/23/14 2036  . morphine 2 MG/ML injection 2 mg  2 mg Intravenous Q4H PRN Harrie Foreman, MD   2 mg at 12/24/14 1409  . nystatin ointment (MYCOSTATIN)   Topical TID Harrie Foreman, MD      . sodium chloride 0.9 % injection 3 mL  3 mL Intravenous Q12H Harrie Foreman, MD   3 mL at 12/25/14 0947  . tamsulosin (FLOMAX) capsule 0.4 mg  0.4 mg Oral Daily Harrie Foreman, MD   0.4 mg at 12/25/14 0947    OBJECTIVE: Filed Vitals:   12/25/14 1550  BP: 121/44  Pulse:   Temp:   Resp:      Body mass index is 38.38 kg/(m^2).    ECOG FS:3 - Symptomatic, >50% confined to bed  General:  Well-developed, well-nourished, no acute distress. Eyes: Pink conjunctiva, anicteric sclera. Lungs: Clear to auscultation bilaterally. Heart: Regular rate and rhythm. No rubs, murmurs, or gallops. Abdomen: Soft, nontender, nondistended. No organomegaly noted, normoactive bowel sounds. Musculoskeletal: No edema, cyanosis, or clubbing. Neuro: Alert, answering all questions appropriately. Cranial nerves grossly intact. Skin: No rashes or petechiae noted. Psych: Normal affect.   LAB RESULTS:  Lab Results  Component Value Date   NA 141 12/25/2014   K 4.4 12/25/2014   CL 104 12/25/2014   CO2 29 12/25/2014   GLUCOSE 154* 12/25/2014   BUN 17 12/25/2014   CREATININE 1.11 12/25/2014   CALCIUM 7.8* 12/25/2014   PROT 6.5 12/22/2014   ALBUMIN 2.9* 12/22/2014   AST 24 12/22/2014   ALT 11* 12/22/2014   ALKPHOS 52 12/22/2014   BILITOT 1.1 12/22/2014   GFRNONAA >60 12/25/2014   GFRAA >60 12/25/2014    Lab Results  Component Value Date   WBC 7.8 12/25/2014   NEUTROABS 5.4 12/22/2014   HGB 12.1* 12/25/2014   HCT 37.7* 12/25/2014   MCV 87.2 12/25/2014   PLT 158 12/25/2014     STUDIES: Dg Chest 1 View  12/24/2014   CLINICAL DATA:  Post thoracentesis  EXAM: CHEST 1 VIEW  COMPARISON:  CTA chest dated 12/15/2014  FINDINGS: No pneumothorax is seen.  Small right pleural effusion. Patchy right lower lobe opacity, likely atelectasis.  Cardiomegaly.  IMPRESSION: No pneumothorax is seen.  Small right pleural effusion.  Patchy right lower lobe opacity, likely atelectasis.   Electronically Signed   By: Julian Hy M.D.   On: 12/24/2014 10:14   Dg Knee 1-2 Views Left  12/23/2014   CLINICAL DATA:  LEFT knee pain for 1 week. No known injury. Unable to bear weight.  EXAM: LEFT KNEE - 1-2 VIEW  COMPARISON:  None.  FINDINGS: In No fracture of the proximal tibia or distal femur. Patella is normal. Small suprapatellar joint effusion.  IMPRESSION: Small joint effusion.  No fracture or dislocation.    Electronically Signed   By: Suzy Bouchard M.D.   On: 12/23/2014 15:39   Ct Angio Chest Pe W/cm &/or Wo Cm  12/23/2014   CLINICAL DATA:  Dyspnea and metastatic melanoma  EXAM: CT ANGIOGRAPHY CHEST WITH CONTRAST  TECHNIQUE: Multidetector CT imaging of the chest was performed using the standard protocol during bolus administration of intravenous contrast. Multiplanar CT image reconstructions and MIPs were obtained to evaluate the vascular anatomy.  CONTRAST:  147m OMNIPAQUE IOHEXOL 350 MG/ML SOLN  COMPARISON:  11/13/2014 PET-CT  FINDINGS: THORACIC INLET/BODY WALL:  No acute abnormality.  MEDIASTINUM:  Chronic cardiomegaly. No pericardial effusion. Diffuse atherosclerosis, including the coronary arteries. No evidence of pulmonary embolism. No acute aortic finding.  Stable left superior mediastinal nodule, 24 mm in maximal diameter.  LUNG WINDOWS:  Moderate to large right pleural effusion, approximately 40% of the right chest volume, with multi segment atelectasis. No indication or pneumonia.  Stable 14 mm left upper lobe metastasis. No new metastatic focus identified. No pleural nodularity.  UPPER ABDOMEN:  Nonspecific haziness of fat around the hepatoduodenal ligament, similar to 11/13/2014 study. Noted negative liver function tests. The patient is status post cholecystectomy.  3 cm right adrenal nodule, previously characterized as adenoma.  OSSEOUS:  No acute fracture.  No suspicious lytic or blastic lesions.  Review of the MIP images confirms the above findings.  IMPRESSION: 1. No evidence of pulmonary embolism. 2. Moderate to large right pleural effusion, significantly increased from 11/13/2014 PET, with multi segment atelectasis. 3. Stable mediastinal and left upper lobe metastases.   Electronically Signed   By: Monte Fantasia M.D.   On: 12/23/2014 07:01   Mr Brain Wo Contrast  12/23/2014   CLINICAL DATA:  74 year old male with metastatic melanoma, 1 week inability to walk and severe lower extremity  pain. Subsequent encounter. PET-CT 11/13/2014.  EXAM: MRI HEAD WITHOUT CONTRAST  TECHNIQUE: Multiplanar, multiecho pulse sequences of the brain and surrounding structures were obtained without intravenous contrast.  COMPARISON:  Brain MRI 03/10/2014.  FINDINGS: The examination had to be discontinued prior to completion due to patient back Pain.  Postcontrast imaging was not obtained.  Stable cerebral volume. Major intracranial vascular flow voids are stable. No restricted diffusion to suggest acute infarct. There appears to be T2 shine through in the right centrum semiovale (series 100, image 40). Overall gray and white matter signal appears stable since 2015. No areas of intracranial mass effect or definite vasogenic edema are identified. No acute intracranial hemorrhage identified. No ventriculomegaly. Pituitary, cervicomedullary junction and visualized upper cervical spine appears stable.  New bilateral mastoid effusions. Negative nasopharynx. Paranasal sinuses remain clear. Orbits soft tissues appear normal postoperative changes to the posterior right scalp at the vertex appear stable. Small left lateral scalp benign lipoma incidentally noted.  IMPRESSION: 1. The examination had to be discontinued prior to completion. No postcontrast imaging could be obtained. 2. No definite metastatic disease to the brain or acute intracranial abnormality. 3. New bilateral mastoid effusions, most often postinflammatory.   Electronically Signed   By: Genevie Ann M.D.   On: 12/23/2014 15:38   Dg Chest Port 1 View  12/22/2014   CLINICAL DATA:  Patient has lung cancer with shortness of breath.  EXAM: PORTABLE CHEST 1 VIEW  COMPARISON:  May 31, 2014  FINDINGS: The heart size is enlarged. The mediastinal contour is stable. There is consolidation of the right mid to lung base. The left lung is clear. No acute abnormalities identified within the visualized bones.  IMPRESSION: Consolidation of the right mid to lung base.    Electronically Signed   By: Abelardo Diesel M.D.   On: 12/22/2014 22:31   US Thoracentesis Asp Pleural Space W/img Guide  12/24/2014   CLINICAL DATA:  Recurrent right pleural effusion. Metastatic melanoma.  EXAM: EXAM THORACENTESIS WITH ULTRASOUND  TECHNIQUE: The procedure, risks (including but not limited to bleeding, infection, organ damage ), benefits, and alternatives were explained to the patient. Questions regarding the procedure were encouraged and answered. The patient understands and consents to the procedure.  Survey ultrasound of  the right hemithorax was performed and an appropriate skin entry site was localized. Site was marked, prepped with Betadine, draped in usual sterile fashion, infiltrated locally with 1% lidocaine.  The Saf-T-Centesis needle was advanced into the pleural space. Thin clear yellow fluid returned. 2.5 L was removed. Post procedure imaging shows no residual fluid. The patient tolerated procedure well.  COMPLICATIONS: COMPLICATIONS None immediate  IMPRESSION: Technically successful ultrasound-guided right thoracentesis. Follow-up chest radiograph pending.   Electronically Signed   By: Lucrezia Europe M.D.   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 Severe leg pain.  PLAN:    1. Melanoma: PET results from November 13, 2014 were reviewed independently and reported essentially stable disease. Patient also has now completed XRT to the lesion at his L5 vertebrae. Patient is receiving Nivolumab approximately every 4 weeks since he lives in [place] in between treatments. MRI the brain is negative. I suspect his pleural effusion is malignant, but cytology is currently pending. Patient has been instructed to keep his previously scheduled follow-up appointment on October 3 for further evaluation and treatment. If he remains in the hospital, can consider treatment as inpatient.  2. Leg pain: Improving. Possibly from  gout flare. MRI the brain negative, could not perform MRI of the lumbar spine. Will hold on this MRI for now, but if no significant improvement by next week would consider rescheduling. 3. Shortness of breath: Secondary to pleural effusion. Patient reports he had thoracentesis in [place] removing 1.8 L. His most recent thoracentesis removed 2.5 L. This is likely malignant, although cytology is pending. If reaccumulation occurs over the next week, patient may benefit from a Pleurx catheter.   Appreciate consult, will follow.   Melanoma of skin   Staging form: Melanoma of the Skin, AJCC 7th Edition     Clinical stage from 08/30/2014: Stage IV [place], NX, M1c) - Signed by Lloyd Huger, MD on 08/30/2014   Lloyd Huger, MD   12/25/2014 4:08 PM

## 2014-12-25 NOTE — Plan of Care (Signed)
Individualization of Care Pt prefers to be called Eddie Elliott Hx of Stage IV melanoma, HTN, CHF, A-fib, GI bleed  Problem: Discharge Progression Outcomes Goal: Other Discharge Outcomes/Goals Outcome: Progressing Plan of care progress to goal for:  1. Discharge Plan:  Possible Discharge in 2-3 Days. 2. Pain:  Patient without complaints of pain this shift.    Pain Controlled with Appropriate Interventions. 3. Hemodynamically:  Patient afebrile and VSS this shift.  BP chronically low - MD aware.  Continues on IV antibiotics. BP 100/56 mmHg  Pulse 93  Temp(Src) 99.5 F (37.5 C) (Oral)  Resp 16  Ht 5\' 7"  (1.702 m)  Wt 249 lb 14.4 oz (113.354 kg)  BMI 39.13 kg/m2  SpO2 100%  4. Complications:  No signs/symptoms of complications noted. 5. Diet:  Patient on regular diet.  Swallows pills whole with water.   6. Activity: Patient remained in bed this shift.

## 2014-12-25 NOTE — Care Management Important Message (Signed)
Important Message  Patient Details  Name: Eddie Elliott MRN: [id number] Date of Birth: [birth year]   Medicare Important Message Given:  Yes-second notification given    Juliann Pulse A Allmond 12/25/2014, 11:16 AM

## 2014-12-25 NOTE — Consult Note (Signed)
  Knee better. Able to bend No effuson, afebile Fluid inflammatory but lab did not see crystals.still suspect gouty arthritis rec: PT, Cont colchicine, add allopurinol 100 mg qd as outpatient

## 2014-12-25 NOTE — Evaluation (Signed)
Physical Therapy Evaluation Patient Details Name: Eddie Elliott MRN: [id number] DOB: April 26, 1940 Today's Date: 12/25/2014   History of Present Illness  Patient is a 74 y/o male that lives 11 months of the year in [place], noted to have shortness of breath and returned home for evaluation with oncology. Patient noted to have history of left upper lobe and mediastinal mets. Patient developed L knee pain on his flight home and has not ambulated any significant distance since. He now states he has knee pain bilaterally. He has a history of gout, which he states he has not had a bout of in years.   Clinical Impression  Patient presents with sub-acute L knee pain of insidious onset, apparently has some R knee pain as well though not as severe as left. Patient states his L knee began hurting while flying home from [place], he has had progressively increased pain and decreased mobility since that time. PT found some possible joint line tenderness, pain with knee extension (even passively), no evidence of Baker's cyst and negative Homan's sign for DVT. His pain seems to be diffuse, and he cannot localize any particular area of consistent pain. Patient at this time states his ROM and mobility have dramatically improved over the past few days, however he is unable to provide sufficient effort to arise from standing as he becomes limited by L knee pain. At this time he appears well below his baseline mobility status and would benefit from skilled PT services to address his mobility deficits.     Follow Up Recommendations SNF    Equipment Recommendations       Recommendations for Other Services       Precautions / Restrictions Precautions Precautions: Fall Restrictions Weight Bearing Restrictions: No      Mobility  Bed Mobility Overal bed mobility: Needs Assistance Bed Mobility: Supine to Sit;Sit to Supine     Supine to sit: Supervision Sit to supine: Supervision   General bed mobility  comments: Patient is fairly independent with bed mobility, needing only for minor balance cues for LEs to come in contact with floor.   Transfers                 General transfer comment: Attempted sit to stand transfer with RW and max A x1 3 separate occassions with no success. Patient is able to slightly rise from bed then collapses with L knee pain. PT elevated bed with same result.   Ambulation/Gait                Stairs            Wheelchair Mobility    Modified Rankin (Stroke Patients Only)       Balance Overall balance assessment: Independent (Independent with seated balance. )                                           Pertinent Vitals/Pain Pain Assessment:  (Minimal at rest, excrutiating 5+/10 with attempts at standing in L knee)    Home Living Family/patient expects to be discharged to:: Private residence Living Arrangements: Spouse/significant other Available Help at Discharge: Family (spouse) Type of Home: House Home Access: Stairs to enter Entrance Stairs-Rails: Can reach both Entrance Stairs-Number of Steps: 6 Home Layout: One level        Prior Function Level of Independence: Independent with assistive device(s)  Comments: Per patient he has been limited with mobility for some time, he did not provide previous use of device information. He had arranged for a w/c for the airport on his arrival.      Hand Dominance        Extremity/Trunk Assessment   Upper Extremity Assessment: Overall WFL for tasks assessed           Lower Extremity Assessment: Overall WFL for tasks assessed (Unable to come to full L knee extension due to pain, L knee flexion WFL, L hip flexion at least 4-4+/5)         Communication   Communication: No difficulties  Cognition Arousal/Alertness: Awake/alert Behavior During Therapy: WFL for tasks assessed/performed Overall Cognitive Status: Within Functional Limits for tasks  assessed                      General Comments General comments (skin integrity, edema, etc.): Relatively firm calves, pain seems to be diffuse, he points near his fibula then the joint line, then into medial and lateral quadriceps (distally).     Exercises        Assessment/Plan    PT Assessment Patient needs continued PT services  PT Diagnosis Difficulty walking;Generalized weakness;Acute pain   PT Problem List Decreased strength;Decreased mobility;Decreased activity tolerance;Decreased balance;Obesity  PT Treatment Interventions DME instruction;Gait training;Neuromuscular re-education;Balance training;Therapeutic exercise;Therapeutic activities   PT Goals (Current goals can be found in the Care Plan section) Acute Rehab PT Goals Patient Stated Goal: To return home and go back to [place] PT Goal Formulation: With patient Time For Goal Achievement: 01/08/15 Potential to Achieve Goals: Good    Frequency Min 2X/week   Barriers to discharge Inaccessible home environment 6 steps to enter his house in [place].     Co-evaluation               End of Session Equipment Utilized During Treatment: Gait belt;Oxygen Activity Tolerance: Patient limited by pain Patient left: in bed;with bed alarm set;with call bell/phone within reach;with SCD's reapplied Nurse Communication: Mobility status         Time: 1500-1520 PT Time Calculation (min) (ACUTE ONLY): 20 min   Charges:   PT Evaluation $Initial PT Evaluation Tier I: 1 Procedure     PT G Codes:       Kerman Passey, PT, DPT    12/25/2014, 5:06 PM

## 2014-12-25 NOTE — Progress Notes (Addendum)
[facility] NAME: Muneeb Veras    MR#:  [id number]  DATE OF BIRTH:  07/21/1940  SUBJECTIVE:  CHIEF COMPLAINT:   Chief Complaint  Patient presents with  . Shortness of Breath   Pain in his knees is improved. Feels a little bit better. Shortness of breath is improved.  REVIEW OF SYSTEMS:    Review of Systems  Constitutional: Negative for fever and chills.  HENT: Negative for congestion and tinnitus.   Eyes: Negative for blurred vision and double vision.  Respiratory: Positive for shortness of breath (improved). Negative for cough and wheezing.   Cardiovascular: Negative for chest pain, orthopnea and PND.  Gastrointestinal: Negative for nausea, vomiting, abdominal pain and diarrhea.  Genitourinary: Negative for dysuria and hematuria.  Musculoskeletal: Positive for joint pain (left knee).  Neurological: Positive for weakness (generalized). Negative for dizziness, sensory change and focal weakness.  All other systems reviewed and are negative.   Nutrition: Regular Tolerating Diet: Yes Tolerating PT: Await evaluation   DRUG ALLERGIES:   Allergies  Allergen Reactions  . No Known Allergies     VITALS:  Blood pressure 90/52, pulse 59, temperature 98 F (36.7 C), temperature source Oral, resp. rate 18, height 5\' 7"  (1.702 m), weight 111.177 kg (245 lb 1.6 oz), SpO2 98 %.  PHYSICAL EXAMINATION:   Physical Exam  GENERAL:  74 y.o.-year-old obese patient sitting up in the bed with no acute distress.  EYES: Pupils equal, round, reactive to light and accommodation. No scleral icterus. Extraocular muscles intact.  HEENT: Head atraumatic, normocephalic. Oropharynx and nasopharynx clear.  NECK:  Supple, no jugular venous distention. No thyroid enlargement, no tenderness.  LUNGS: Good air entry bilaterally, no wheezing, rales, rhonchi. No use of accessory muscles of respiration.  CARDIOVASCULAR: S1, S2 normal. No  murmurs, rubs, or gallops.  ABDOMEN: Soft, nontender, nondistended. Bowel sounds present. No organomegaly or mass.  EXTREMITIES: No cyanosis, clubbing, +1 pitting edema from the knees to the ankles bilaterally. chronic changes of venous stasis on lower extremity is bilaterally. NEUROLOGIC: Cranial nerves II through XII are intact. No focal Motor or sensory deficits b/l.  Globally weak PSYCHIATRIC: The patient is alert and oriented x 3. Good affect SKIN: No obvious rash, lesion, or ulcer.    LABORATORY PANEL:   CBC  Recent Labs Lab 12/25/14 0431  WBC 7.8  HGB 12.1*  HCT 37.7*  PLT 158   ------------------------------------------------------------------------------------------------------------------  Chemistries   Recent Labs Lab 12/22/14 2140  12/25/14 0431  NA 137  < > 141  K 3.9  < > 4.4  CL 97*  < > 104  CO2 31  < > 29  GLUCOSE 89  < > 154*  BUN 22*  < > 17  CREATININE 1.30*  < > 1.11  CALCIUM 8.1*  < > 7.8*  AST 24  --   --   ALT 11*  --   --   ALKPHOS 52  --   --   BILITOT 1.1  --   --   < > = values in this interval not displayed. ------------------------------------------------------------------------------------------------------------------  Cardiac Enzymes  Recent Labs Lab 12/22/14 2140  TROPONINI <0.03   ------------------------------------------------------------------------------------------------------------------  RADIOLOGY:  Dg Chest 1 View  12/24/2014   CLINICAL DATA:  Post thoracentesis  EXAM: CHEST 1 VIEW  COMPARISON:  CTA chest dated 12/15/2014  FINDINGS: No pneumothorax is seen.  Small right pleural effusion. Patchy right lower lobe opacity, likely atelectasis.  Cardiomegaly.  IMPRESSION: No pneumothorax is seen.  Small right pleural effusion.  Patchy right lower lobe opacity, likely atelectasis.   Electronically Signed   By: Julian Hy M.D.   On: 12/24/2014 10:14   Dg Knee 1-2 Views Left  12/23/2014   CLINICAL DATA:  LEFT knee pain  for 1 week. No known injury. Unable to bear weight.  EXAM: LEFT KNEE - 1-2 VIEW  COMPARISON:  None.  FINDINGS: In No fracture of the proximal tibia or distal femur. Patella is normal. Small suprapatellar joint effusion.  IMPRESSION: Small joint effusion.  No fracture or dislocation.   Electronically Signed   By: Suzy Bouchard M.D.   On: 12/23/2014 15:39   Mr Brain Wo Contrast  12/23/2014   CLINICAL DATA:  74 year old male with metastatic melanoma, 1 week inability to walk and severe lower extremity pain. Subsequent encounter. PET-CT 11/13/2014.  EXAM: MRI HEAD WITHOUT CONTRAST  TECHNIQUE: Multiplanar, multiecho pulse sequences of the brain and surrounding structures were obtained without intravenous contrast.  COMPARISON:  Brain MRI 03/10/2014.  FINDINGS: The examination had to be discontinued prior to completion due to patient back Pain.  Postcontrast imaging was not obtained.  Stable cerebral volume. Major intracranial vascular flow voids are stable. No restricted diffusion to suggest acute infarct. There appears to be T2 shine through in the right centrum semiovale (series 100, image 40). Overall gray and white matter signal appears stable since 2015. No areas of intracranial mass effect or definite vasogenic edema are identified. No acute intracranial hemorrhage identified. No ventriculomegaly. Pituitary, cervicomedullary junction and visualized upper cervical spine appears stable.  New bilateral mastoid effusions. Negative nasopharynx. Paranasal sinuses remain clear. Orbits soft tissues appear normal postoperative changes to the posterior right scalp at the vertex appear stable. Small left lateral scalp benign lipoma incidentally noted.  IMPRESSION: 1. The examination had to be discontinued prior to completion. No postcontrast imaging could be obtained. 2. No definite metastatic disease to the brain or acute intracranial abnormality. 3. New bilateral mastoid effusions, most often postinflammatory.    Electronically Signed   By: Genevie Ann M.D.   On: 12/23/2014 15:38   US Thoracentesis Asp Pleural Space W/img Guide  12/24/2014   CLINICAL DATA:  Recurrent right pleural effusion. Metastatic melanoma.  EXAM: EXAM THORACENTESIS WITH ULTRASOUND  TECHNIQUE: The procedure, risks (including but not limited to bleeding, infection, organ damage ), benefits, and alternatives were explained to the patient. Questions regarding the procedure were encouraged and answered. The patient understands and consents to the procedure.  Survey ultrasound of the right hemithorax was performed and an appropriate skin entry site was localized. Site was marked, prepped with Betadine, draped in usual sterile fashion, infiltrated locally with 1% lidocaine.  The Saf-T-Centesis needle was advanced into the pleural space. Thin clear yellow fluid returned. 2.5 L was removed. Post procedure imaging shows no residual fluid. The patient tolerated procedure well.  COMPLICATIONS: COMPLICATIONS None immediate  IMPRESSION: Technically successful ultrasound-guided right thoracentesis. Follow-up chest radiograph pending.   Electronically Signed   By: Lucrezia Europe M.D.   On: 12/24/2014 10:54     ASSESSMENT AND PLAN:   74 year old male with past medical history of GI bleed, stage IV melanoma, history of gout, history of CHF, hypertension who presented to the hospital due to shortness of breath and having difficulty walking.  #1 shortness of breath-most likely due to the right-sided pleural effusion which was seen on the CT scan of the chest. no evidence of pulmonary embolism.  -Possible underlying  pneumonia, continue empiric ceftriaxone/Zithromax. -Status post ultrasound-guided thoracentesis 9/29 with 2.5 L of fluid removed. Fluid analysis consistent with a transudative pleural effusion.  Await fluid cytology given his history of malignancy. -Continue oxygen support and supportive care for now. -We'll repeat chest x-ray in a.m. Tomorrow.   #2  back/knee pain and difficulty walking-X-ray of the left knee did show some swelling but no evidence of acute bony abnormality. -seen by rheumatology and appreciate input, suspected to have acute gout. Continue colchicine. Uric acid level noted to be elevated and will likely discharge on oral allopurinol. -Continue supportive care, will get physical therapy consult to assess mobility.  #3 history of chronic diastolic CHF-clinically patient is not in congestive heart failure. -Continue Coreg, Lasix  #4 history of chronic atrial fibrillation-currently rate controlled and continue Coreg.   - Discussed with Dr. Saralyn Pilar and due to pt's follow up issues from going back and forth from [place] he is not on long term anticoagulation and cont. ASA for now.  Discussed with him and o.k. To hold Propafenone for now.    #5 BPH-continue Flomax.  #6 anxiety-continue as needed Xanax.  #7 history of stage IV melanoma-patient being followed by oncology by Dr. Grayland Ormond. -We will await cytology results of his pleural fluid.  MRI of the brain was negative for any metastatic disease.   All the records are reviewed and case discussed with Care Management/Social Workerr. Management plans discussed with the patient, family and they are in agreement.  CODE STATUS: Full  DVT Prophylaxis: Teds and SCDs  TOTAL TIME TAKING CARE OF THIS PATIENT: 25 minutes.   POSSIBLE D/C IN 2-3 DAYS, DEPENDING ON CLINICAL CONDITION.   Henreitta Leber M.D on 12/25/2014 at 1:53 PM  Between 7am to 6pm - Pager - [phone]  After 6pm go to [email or web address] - password [email or web address] [facility]  Office  [phone]  CC: Primary care physician; Juluis Pitch, MD

## 2014-12-26 ENCOUNTER — Inpatient Hospital Stay: Payer: Medicare Other

## 2014-12-26 LAB — CBC
HCT: 37 % — ABNORMAL LOW (ref 40.0–52.0)
HEMOGLOBIN: 11.8 g/dL — AB (ref 13.0–18.0)
MCH: 27.3 pg (ref 26.0–34.0)
MCHC: 31.8 g/dL — ABNORMAL LOW (ref 32.0–36.0)
MCV: 85.8 fL (ref 80.0–100.0)
PLATELETS: 187 10*3/uL (ref 150–440)
RBC: 4.31 MIL/uL — AB (ref 4.40–5.90)
RDW: 17.8 % — ABNORMAL HIGH (ref 11.5–14.5)
WBC: 11.5 10*3/uL — AB (ref 3.8–10.6)

## 2014-12-26 LAB — BASIC METABOLIC PANEL
ANION GAP: 5 (ref 5–15)
BUN: 25 mg/dL — ABNORMAL HIGH (ref 6–20)
CHLORIDE: 107 mmol/L (ref 101–111)
CO2: 30 mmol/L (ref 22–32)
Calcium: 7.7 mg/dL — ABNORMAL LOW (ref 8.9–10.3)
Creatinine, Ser: 1.09 mg/dL (ref 0.61–1.24)
Glucose, Bld: 133 mg/dL — ABNORMAL HIGH (ref 65–99)
POTASSIUM: 4.3 mmol/L (ref 3.5–5.1)
SODIUM: 142 mmol/L (ref 135–145)

## 2014-12-26 NOTE — Plan of Care (Signed)
Individualization of Care Pt prefers to be called Eddie Elliott Hx of Stage IV melanoma, HTN, CHF, A-fib, GI bleed  Problem: Discharge Progression Outcomes Goal: Other Discharge Outcomes/Goals Outcome: Progressing Plan of care progress to goal for:  1. Discharge Plan: Possible Discharge in 2-3 Days.  If not discharged in time for 10/3 chemo appointment, physician may consider inpatient treatment.   2. Pain: Patient with complaints of pain in back x 1 this shift.  Norco given with pain resolved upon reassessment.  3. Hemodynamically: Patient afebrile and VSS this shift. BP chronically low - MD aware. Continues on IV antibiotics. BP 103/60 mmHg  Pulse 50  Temp(Src) 97.7 F (36.5 C) (Oral)  Resp 18  Ht 5\' 7"  (1.702 m)  Wt 245 lb 1.6 oz (111.177 kg)  BMI 38.38 kg/m2  SpO2 100% 4. Complications: No signs/symptoms of complications noted. 5. Diet: Patient on regular diet. Swallows pills whole with water.  6. Activity: Patient was up on side of bed for periods to alleviate back discomfort.  Tolerated well.

## 2014-12-26 NOTE — Plan of Care (Signed)
Problem: Discharge Progression Outcomes Goal: Other Discharge Outcomes/Goals Outcome: Progressing Plan of care progress to goal for: 1. Discharge Plan:         In Place and Appropriate.         Possible Discharge in 2-3 Days. 2. Pain:         Pain Controlled with Appropriate Interventions. 3. Hemodynamically Stable:        Hypotensive. MD Aware.        Afebrile.        Remains on IV Antibiotics and Oral Antibiotics.              Labs Improving.            4. Complications:         No signs/symptoms of complications noted. 5. Diet:         Regular Diet. Tolerating Well.         Thin Liquids. Tolerating Well. 6. Activity:         Bedrest.

## 2014-12-26 NOTE — Progress Notes (Signed)
[facility] [place] NAME: Eddie Elliott    MR#:  [id number]  DATE OF BIRTH:  [birth year]  SUBJECTIVE:  CHIEF COMPLAINT:   Chief Complaint  Patient presents with  . Shortness of Breath   Pain in his knees is improved. Feels a little bit better. Shortness of breath is improved.  REVIEW OF SYSTEMS:    Review of Systems  Constitutional: Negative for fever and chills.  HENT: Negative for congestion and tinnitus.   Eyes: Negative for blurred vision and double vision.  Respiratory: Negative for cough, shortness of breath (improved) and wheezing.   Cardiovascular: Negative for chest pain, orthopnea and PND.  Gastrointestinal: Negative for nausea, vomiting, abdominal pain and diarrhea.  Genitourinary: Negative for dysuria and hematuria.  Musculoskeletal: Positive for joint pain (left knee).       Left knee pain improved,range of motion improved.  Neurological: Positive for weakness (generalized). Negative for dizziness, sensory change and focal weakness.  All other systems reviewed and are negative.   Nutrition: Regular Tolerating Diet: Yes Tolerating PT: Await evaluation   DRUG ALLERGIES:   Allergies  Allergen Reactions  . No Known Allergies     VITALS:  Blood pressure 110/58, pulse 69, temperature 97.8 F (36.6 C), temperature source Oral, resp. rate 20, height 5\' 7"  (1.702 m), weight 113.535 kg (250 lb 4.8 oz), SpO2 97 %.  PHYSICAL EXAMINATION:   Physical Exam  GENERAL:  74 y.o.-year-old obese patient sitting up in the bed with no acute distress.  EYES: Pupils equal, round, reactive to light and accommodation. No scleral icterus. Extraocular muscles intact.  HEENT: Head atraumatic, normocephalic. Oropharynx and nasopharynx clear.  NECK:  Supple, no jugular venous distention. No thyroid enlargement, no tenderness.  LUNGS: Good air entry bilaterally, no wheezing, rales, rhonchi. No use of accessory muscles of  respiration.  CARDIOVASCULAR: S1, S2 normal. No murmurs, rubs, or gallops.  ABDOMEN: Soft, nontender, nondistended. Bowel sounds present. No organomegaly or mass.  EXTREMITIES: No cyanosis, clubbing, +1 pitting edema from the knees to the ankles bilaterally. chronic changes of venous stasis on lower extremity is bilaterally. Less present along the lower part of the left knee around the patellar area. No redness, no warmth. NEUROLOGIC: Cranial nerves II through XII are intact. No focal Motor or sensory deficits b/l.  Globally weak PSYCHIATRIC: The patient is alert and oriented x 3. Good affect SKIN: No obvious rash, lesion, or ulcer.    LABORATORY PANEL:   CBC  Recent Labs Lab 12/26/14 0557  WBC 11.5*  HGB 11.8*  HCT 37.0*  PLT 187   ------------------------------------------------------------------------------------------------------------------  Chemistries   Recent Labs Lab 12/22/14 2140  12/26/14 0557  NA 137  < > 142  K 3.9  < > 4.3  CL 97*  < > 107  CO2 31  < > 30  GLUCOSE 89  < > 133*  BUN 22*  < > 25*  CREATININE 1.30*  < > 1.09  CALCIUM 8.1*  < > 7.7*  AST 24  --   --   ALT 11*  --   --   ALKPHOS 52  --   --   BILITOT 1.1  --   --   < > = values in this interval not displayed. ------------------------------------------------------------------------------------------------------------------  Cardiac Enzymes  Recent Labs Lab 12/22/14 2140  TROPONINI <0.03   ------------------------------------------------------------------------------------------------------------------  RADIOLOGY:  Dg Chest 1 View  12/26/2014   CLINICAL DATA:  Pleural effusion  EXAM: CHEST  1 VIEW  COMPARISON:  12/24/2014  FINDINGS: Stable enlarged cardiac silhouette. There is improvement in RIGHT basilar atelectasis. Small bilateral effusions.  IMPRESSION: Improved RIGHT basilar atelectasis.  Small effusions and cardiomegaly.   Electronically Signed   By: Suzy Bouchard M.D.   On:  12/26/2014 07:35   Dg Chest 1 View  12/24/2014   CLINICAL DATA:  Post thoracentesis  EXAM: CHEST 1 VIEW  COMPARISON:  CTA chest dated 12/15/2014  FINDINGS: No pneumothorax is seen.  Small right pleural effusion. Patchy right lower lobe opacity, likely atelectasis.  Cardiomegaly.  IMPRESSION: No pneumothorax is seen.  Small right pleural effusion.  Patchy right lower lobe opacity, likely atelectasis.   Electronically Signed   By: Julian Hy M.D.   On: 12/24/2014 10:14   US Thoracentesis Asp Pleural Space W/img Guide  12/24/2014   CLINICAL DATA:  Recurrent right pleural effusion. Metastatic melanoma.  EXAM: EXAM THORACENTESIS WITH ULTRASOUND  TECHNIQUE: The procedure, risks (including but not limited to bleeding, infection, organ damage ), benefits, and alternatives were explained to the patient. Questions regarding the procedure were encouraged and answered. The patient understands and consents to the procedure.  Survey ultrasound of the right hemithorax was performed and an appropriate skin entry site was localized. Site was marked, prepped with Betadine, draped in usual sterile fashion, infiltrated locally with 1% lidocaine.  The Saf-T-Centesis needle was advanced into the pleural space. Thin clear yellow fluid returned. 2.5 L was removed. Post procedure imaging shows no residual fluid. The patient tolerated procedure well.  COMPLICATIONS: COMPLICATIONS None immediate  IMPRESSION: Technically successful ultrasound-guided right thoracentesis. Follow-up chest radiograph pending.   Electronically Signed   By: Lucrezia Europe M.D.   On: 12/24/2014 10:54     ASSESSMENT AND PLAN:   74 year old male with past medical history of GI bleed, stage IV melanoma, history of gout, history of CHF, hypertension who presented to the hospital due to shortness of breath and having difficulty walking.  #1 shortness of breath-most likely due to the right-sided pleural effusion which was seen on the CT scan of the chest.  no evidence of pulmonary embolism.  -Possible underlying pneumonia, continue empiric ceftriaxone/Zithromax. -Status post ultrasound-guided thoracentesis 9/29 with 2.5 L of fluid removed. Fluid analysis consistent with a transudative pleural effusion.  Pathology report is pending.-Discontinue oxygen and see how much she maintains on room air. If r eaccumulation  Occurs on  The Chest X-Ray Today ,Patient May Benefit from Pleurx Catheter.   #2 back/knee pain and difficulty walking-X-ray of the left knee did show some swelling but no evidence of acute bony abnormality. -acute gout: Off left knee; Continue colchicine, allopurinol. Patient clinically improving. Physical therapy recommended SNF placement but his knee  pain is improved,  reevaluation by physical therapy because his gout symptoms are improving he may participate more with the therapy Ambulatory at home and independent at home.  #3 history of chronic diastolic CHF-clinically patient is not in congestive heart failure. -Continue Coreg, Lasix  #4 history of chronic atrial fibrillation-currently rate controlled and continue Coreg.   - Discussed with Dr. Saralyn Pilar and due to pt's follow up issues from going back and forth from [place] he is not on long term anticoagulation and cont. ASA for now.  Discussed with him and o.k. To hold Propafenone for now.    #5 BPH-continue Flomax.  #6 anxiety-continue as needed Xanax.  #7 history of stage IV melanoma-patient being followed by oncology by Dr. Grayland Ormond. Next Chemotherapy is on October 3.. -We  will await cytology results of his pleural fluid.  MRI of the brain was negative for any metastatic disease.   All the records are reviewed and case discussed with Care Management/Social Workerr. Management plans discussed with the patient, family and they are in agreement.  CODE STATUS: Full  DVT Prophylaxis: Teds and SCDs  TOTAL TIME TAKING CARE OF THIS PATIENT: 25 minutes.   POSSIBLE D/C IN 2-3  DAYS, DEPENDING ON CLINICAL CONDITION.   Epifanio Lesches M.D on 12/26/2014 at 8:34 AM  Between 7am to 6pm - Pager - [phone]  After 6pm go to [email or web address] - password [email or web address] [facility]  Office  [phone]  CC: Primary care physician; Juluis Pitch, MD

## 2014-12-27 LAB — CULTURE, BLOOD (ROUTINE X 2)
CULTURE: NO GROWTH
CULTURE: NO GROWTH

## 2014-12-27 LAB — SEDIMENTATION RATE: SED RATE: 19 mm/h (ref 0–20)

## 2014-12-27 LAB — MAGNESIUM: Magnesium: 1.2 mg/dL — ABNORMAL LOW (ref 1.7–2.4)

## 2014-12-27 LAB — CK: CK TOTAL: 42 U/L — AB (ref 49–397)

## 2014-12-27 LAB — POTASSIUM: Potassium: 4.2 mmol/L (ref 3.5–5.1)

## 2014-12-27 MED ORDER — DEXTROSE 5 % IV SOLN
1.0000 g | INTRAVENOUS | Status: DC
  Filled 2014-12-27: qty 10

## 2014-12-27 MED ORDER — ALLOPURINOL 100 MG PO TABS
100.0000 mg | ORAL_TABLET | Freq: Every day | ORAL | Status: DC
  Administered 2014-12-27 – 2014-12-30 (×4): 100 mg via ORAL
  Filled 2014-12-27 (×4): qty 1

## 2014-12-27 MED ORDER — PREDNISONE 20 MG PO TABS
20.0000 mg | ORAL_TABLET | Freq: Every day | ORAL | Status: DC
  Administered 2014-12-27 – 2014-12-29 (×3): 20 mg via ORAL
  Filled 2014-12-27 (×3): qty 1

## 2014-12-27 MED ORDER — METAXALONE 800 MG PO TABS
800.0000 mg | ORAL_TABLET | Freq: Three times a day (TID) | ORAL | Status: DC
  Administered 2014-12-27 – 2014-12-30 (×8): 800 mg via ORAL
  Filled 2014-12-27 (×11): qty 1

## 2014-12-27 MED ORDER — PANTOPRAZOLE SODIUM 40 MG PO TBEC
40.0000 mg | DELAYED_RELEASE_TABLET | Freq: Every day | ORAL | Status: DC
  Administered 2014-12-27 – 2014-12-30 (×4): 40 mg via ORAL
  Filled 2014-12-27 (×4): qty 1

## 2014-12-27 MED ORDER — MAGNESIUM SULFATE 2 GM/50ML IV SOLN
2.0000 g | Freq: Once | INTRAVENOUS | Status: AC
  Administered 2014-12-27: 14:00:00 2 g via INTRAVENOUS
  Filled 2014-12-27: qty 50

## 2014-12-27 NOTE — Progress Notes (Signed)
[facility] [place] NAME: Eddie Elliott    MR#:  [id number]  DATE OF BIRTH:  1940-11-12  SUBJECTIVE: Complains of weakness in both legs, pain in the left knee.   CHIEF COMPLAINT:   Chief Complaint  Patient presents with  . Shortness of Breath   Pain in his knees is improved. Feels a little bit better. Shortness of breath is improved.  REVIEW OF SYSTEMS:    Review of Systems  Constitutional: Negative for fever and chills.  HENT: Negative for congestion and tinnitus.   Eyes: Negative for blurred vision and double vision.  Respiratory: Negative for cough, shortness of breath (improved) and wheezing.   Cardiovascular: Negative for chest pain, orthopnea and PND.  Gastrointestinal: Negative for nausea, vomiting, abdominal pain and diarrhea.  Genitourinary: Negative for dysuria and hematuria.  Musculoskeletal: Positive for joint pain (left knee).       Left knee pain improved,range of motion improved.  Neurological: Positive for weakness (generalized). Negative for dizziness, sensory change and focal weakness.  All other systems reviewed and are negative.   Nutrition: Regular Tolerating Diet: Yes Tolerating PT: Await evaluation   DRUG ALLERGIES:   Allergies  Allergen Reactions  . No Known Allergies     VITALS:  Blood pressure 120/76, pulse 95, temperature 98.8 F (37.1 C), temperature source Oral, resp. rate 18, height 5' 7" (1.702 m), weight 111.993 kg (246 lb 14.4 oz), SpO2 100 %.  PHYSICAL EXAMINATION:   Physical Exam  GENERAL:  74 y.o.-year-old obese patient sitting up in the bed with no acute distress.  EYES: Pupils equal, round, reactive to light and accommodation. No scleral icterus. Extraocular muscles intact.  HEENT: Head atraumatic, normocephalic. Oropharynx and nasopharynx clear.  NECK:  Supple, no jugular venous distention. No thyroid enlargement, no tenderness.  LUNGS: Good air entry bilaterally, no  wheezing, rales, rhonchi. No use of accessory muscles of respiration.  CARDIOVASCULAR: S1, S2 normal. No murmurs, rubs, or gallops.  ABDOMEN: Soft, nontender, nondistended. Bowel sounds present. No organomegaly or mass.  EXTREMITIES: No cyanosis, clubbing, +1 pitting edema from the knees to the ankles bilaterally. chronic changes of venous stasis on lower extremity is bilaterally. Left knee slightly warm but no redness, no tenderness to palpation. His pain is mainly around the muscle below the  knee. NEUROLOGIC: Cranial nerves II through XII are intact. No focal Motor or sensory deficits b/l.  Globally weak PSYCHIATRIC: The patient is alert and oriented x 3. Good affect SKIN: No obvious rash, lesion, or ulcer.    LABORATORY PANEL:   CBC  Recent Labs Lab 12/26/14 0557  WBC 11.5*  HGB 11.8*  HCT 37.0*  PLT 187   ------------------------------------------------------------------------------------------------------------------  Chemistries   Recent Labs Lab 12/22/14 2140  12/26/14 0557  NA 137  < > 142  K 3.9  < > 4.3  CL 97*  < > 107  CO2 31  < > 30  GLUCOSE 89  < > 133*  BUN 22*  < > 25*  CREATININE 1.30*  < > 1.09  CALCIUM 8.1*  < > 7.7*  AST 24  --   --   ALT 11*  --   --   ALKPHOS 52  --   --   BILITOT 1.1  --   --   < > = values in this interval not displayed. ------------------------------------------------------------------------------------------------------------------  Cardiac Enzymes  Recent Labs Lab 12/22/14 2140  TROPONINI <0.03   ------------------------------------------------------------------------------------------------------------------  RADIOLOGY:  Dg Chest 1 View  12/26/2014   CLINICAL DATA:  Pleural effusion  EXAM: CHEST 1 VIEW  COMPARISON:  12/24/2014  FINDINGS: Stable enlarged cardiac silhouette. There is improvement in RIGHT basilar atelectasis. Small bilateral effusions.  IMPRESSION: Improved RIGHT basilar atelectasis.  Small effusions  and cardiomegaly.   Electronically Signed   By: Suzy Bouchard M.D.   On: 12/26/2014 07:35     ASSESSMENT AND PLAN:   74 year old male with past medical history of GI bleed, stage IV melanoma, history of gout, history of CHF, hypertension who presented to the hospital due to shortness of breath and having difficulty walking.  #1 shortness of breath-most likely due to the right-sided pleural effusion which was seen on the CT scan of the chest. no evidence of pulmonary embolism.  -Possible underlying pneumonia, continue empiric ceftriaxone/Zithromax. -Status post ultrasound-guided thoracentesis 9/29 with 2.5 L of fluid removed. Fluid analysis consistent with a transudative pleural effusion.  Pathology report is pending.-Discontinue oxygen and see how much she maintains on room air. If r eaccumulation  Occurs on  The x-ray yesterday showed improvement of pleural effusion, no new changes.   #2 back/knee pain and difficulty walking-X-ray of the left knee did show some swelling but no evidence of acute bony abnormality. -acute gout: Off left knee; Continue colchicine, prednisone but secondary to her continued pain. CK, ESR ,cpk for possible myositis/ because of history complaints of weakness in both legs.   #3 history of chronic diastolic CHF-clinically patient is not in congestive heart failure. -Continue Coreg, Lasix  #4 history of chronic atrial fibrillation-currently rate controlled and continue Coreg.   - Discussed with Dr. Saralyn Pilar and due to pt's follow up issues from going back and forth from [place] he is not on long term anticoagulation and cont. ASA for now.  Discussed with him and o.k. To hold Propafenone for now.    #5 BPH-continue Flomax.  #6 anxiety-continue as needed Xanax.  #7 history of stage IV melanoma-patient being followed by oncology by Dr. Grayland Ormond. Next Chemotherapy is on October 3.. -We will await cytology results of his pleural fluid.  MRI of the brain was negative  for any metastatic disease.   All the records are reviewed and case discussed with Care Management/Social Workerr. Management plans discussed with the patient, family and they are in agreement.  CODE STATUS: Full  DVT Prophylaxis: Teds and SCDs  TOTAL TIME TAKING CARE OF THIS PATIENT: 25 minutes.   POSSIBLE D/C IN 2-3 DAYS, DEPENDING ON CLINICAL CONDITION.   Eddie Elliott M.D on 12/27/2014 at 8:54 AM  Between 7am to 6pm - Pager - [phone]  After 6pm go to [email or web address] - password [email or web address] [facility]  Office  [phone]  CC: Primary care physician; Juluis Pitch, MD

## 2014-12-27 NOTE — Plan of Care (Signed)
Problem: Discharge Progression Outcomes Goal: Other Discharge Outcomes/Goals Outcome: Progressing Plan of care progress to goal for: 1. Discharge Plan:In Place and Appropriate.Possible Discharge in 2-3 Days. 2. Pain: No c/o pain this shift.  3. Hemodynamically Stable: VSS, Afebrile this shift, antibiotics continue, labs improving continue to monitor.      4. No signs/symptoms of complications noted. 5. Diet: tolerating, no c/o nausea or vomiting this shift.      6. Activity:Bedrest.

## 2014-12-27 NOTE — Plan of Care (Signed)
Problem: Discharge Progression Outcomes Goal: Pain controlled with appropriate interventions Outcome: Not Progressing Pt c/o L leg pain - particularly knee and ankle.  Neither Norco or morphine helped.  Got order for skelaxin.   Goal: Hemodynamically stable Outcome: Progressing VSS Goal: Complications resolved/controlled Outcome: Not Progressing Pt's L leg is so painful, he can't put any weight on it.  PT recommending SNF.  Pt has Stage IV melanoma and is supposed to have immunotherapy tomorrow, and pt's wife expressed interest in obtaining a port. Pt is also very dyspneic on exertion even just rolling over in bed.  Goal: Tolerating diet Outcome: Progressing Pt has good appetite.  Printed info on diet requirements to prevent or reduce pain of gout.   Goal: Activity appropriate for discharge plan Outcome: Not Progressing Not able to bear weight b/c of L leg pain.  Goal: Other Discharge Outcomes/Goals Outcome: Progressing PT recommending SNF but don't know how this will work w/pt's desire to return home to [place].

## 2014-12-28 ENCOUNTER — Inpatient Hospital Stay: Payer: Medicare Other

## 2014-12-28 ENCOUNTER — Ambulatory Visit: Payer: Medicare Other

## 2014-12-28 ENCOUNTER — Inpatient Hospital Stay: Payer: Medicare Other | Admitting: Oncology

## 2014-12-28 ENCOUNTER — Other Ambulatory Visit: Payer: Medicare Other

## 2014-12-28 ENCOUNTER — Ambulatory Visit: Payer: Medicare Other | Admitting: Oncology

## 2014-12-28 DIAGNOSIS — R609 Edema, unspecified: Secondary | ICD-10-CM

## 2014-12-28 DIAGNOSIS — J90 Pleural effusion, not elsewhere classified: Secondary | ICD-10-CM

## 2014-12-28 LAB — C DIFFICILE QUICK SCREEN W PCR REFLEX
C DIFFICILE (CDIFF) TOXIN: NEGATIVE
C DIFFICLE (CDIFF) ANTIGEN: NEGATIVE
C Diff interpretation: NEGATIVE

## 2014-12-28 LAB — BODY FLUID CULTURE

## 2014-12-28 LAB — CREATININE, SERUM: Creatinine, Ser: 1.07 mg/dL (ref 0.61–1.24)

## 2014-12-28 LAB — GLUCOSE, CAPILLARY: Glucose-Capillary: 100 mg/dL — ABNORMAL HIGH (ref 65–99)

## 2014-12-28 LAB — CYTOLOGY - NON PAP

## 2014-12-28 MED ORDER — SODIUM CHLORIDE 0.9 % IV SOLN
3.0000 mg/kg | Freq: Once | INTRAVENOUS | Status: DC
  Filled 2014-12-28: qty 31

## 2014-12-28 MED ORDER — SODIUM CHLORIDE 0.9 % IV SOLN
340.0000 mg | Freq: Once | INTRAVENOUS | Status: AC
  Administered 2014-12-28: 15:00:00 340 mg via INTRAVENOUS
  Filled 2014-12-28: qty 26

## 2014-12-28 MED ORDER — ONDANSETRON HCL 4 MG/2ML IJ SOLN
4.0000 mg | INTRAMUSCULAR | Status: DC | PRN
  Administered 2014-12-28: 20:00:00 4 mg via INTRAVENOUS
  Filled 2014-12-28: qty 2

## 2014-12-28 MED ORDER — MAGNESIUM SULFATE 2 GM/50ML IV SOLN
2.0000 g | Freq: Once | INTRAVENOUS | Status: AC
  Administered 2014-12-28: 2 g via INTRAVENOUS
  Filled 2014-12-28: qty 50

## 2014-12-28 MED ORDER — SODIUM CHLORIDE 0.9 % IV SOLN
Freq: Once | INTRAVENOUS | Status: AC
  Administered 2014-12-28: 15:00:00 via INTRAVENOUS

## 2014-12-28 NOTE — Progress Notes (Signed)
Eddie Elliott  [phone] [phone]  ID: Bebe Liter OB: [birth year]  MR#: [id number]  IDP#:[id number]  Patient Care Team: Juluis Pitch, MD as PCP - General (Family Medicine)  CHIEF COMPLAINT:  Chief Complaint  Patient presents with  . Shortness of Breath    INTERVAL HISTORY: Patient's shortness of breath is significantly improved. Knee pain is also improved. He denies any other pain. He continues to have a decreased performance status, but offers no further complaints. Patient leaving for ultrasound to assess for DVT.  REVIEW OF SYSTEMS:   Review of Systems  Constitutional: Positive for malaise/fatigue. Negative for fever.  Respiratory: Positive for shortness of breath.   Cardiovascular: Negative.   Gastrointestinal: Negative.   Musculoskeletal: Positive for joint pain.  Neurological: Positive for weakness.    As per HPI. Otherwise, a complete review of systems is negatve.  PAST MEDICAL HISTORY: Past Medical History  Diagnosis Date  . 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 Hypertension   . Gout   . CHF (congestive heart failure)     PAST SURGICAL HISTORY: Past Surgical History  Procedure Laterality Date  . Appendectomy    . Partial hip arthroplasty Right     FAMILY HISTORY History reviewed. No pertinent family history.     ADVANCED DIRECTIVES:    HEALTH MAINTENANCE: Social History  Substance Use Topics  . Smoking status: Current Every Day Smoker -- 1.00 packs/day    Types: Cigarettes  . Smokeless tobacco: Never Used  . Alcohol Use: No     Colonoscopy:  PAP:  Bone density:  Lipid panel:  Allergies  Allergen Reactions  . No Known Allergies     Current Facility-Administered Medications  Medication Dose Route Frequency Provider Last Rate Last Dose  . acetaminophen (TYLENOL) tablet 650 mg  650 mg Oral Q6H PRN Harrie Foreman, MD       Or  . acetaminophen (TYLENOL) suppository 650 mg  650 mg Rectal Q6H PRN Harrie Foreman, MD      . allopurinol (ZYLOPRIM) tablet 100 mg  100 mg Oral Daily Epifanio Lesches, MD   100 mg at 12/28/14 1104  . ALPRAZolam Duanne Moron) tablet 0.25 mg  0.25 mg Oral BID PRN Harrie Foreman, MD   0.25 mg at 12/23/14 1356  . carvedilol (COREG) tablet 3.125 mg  3.125 mg Oral BID WC Harrie Foreman, MD   3.125 mg at 12/28/14 0754  . colchicine tablet 0.6 mg  0.6 mg Oral BID Henreitta Leber, MD   0.6 mg at 12/28/14 1104  . diphenhydrAMINE (BENADRYL) capsule 25 mg  25 mg Oral Q4H PRN Harrie Foreman, MD   25 mg at 12/27/14 2059  . docusate sodium (COLACE) capsule 100 mg  100 mg Oral BID Harrie Foreman, MD   100 mg at 12/28/14 1104  . enoxaparin (LOVENOX) injection 40 mg  40 mg Subcutaneous Q24H Henreitta Leber, MD   40 mg at 12/28/14 1517  . furosemide (LASIX) tablet 20 mg  20 mg Oral Daily Harrie Foreman, MD   20 mg at 12/28/14 1104  . gadobenate dimeglumine (MULTIHANCE) injection 20 mL  20 mL Intravenous Once PRN Medication Radiologist, MD      . HYDROcodone-acetaminophen (NORCO/VICODIN) 5-325 MG per tablet 1 tablet  1 tablet Oral Q6H PRN Harrie Foreman, MD   1 tablet at 12/27/14 2059  .  metaxalone (SKELAXIN) tablet 800 mg  800 mg Oral TID Epifanio Lesches, MD   800 mg at 12/28/14 1104  . morphine 2 MG/ML injection 2 mg  2 mg Intravenous Q4H PRN Harrie Foreman, MD   2 mg at 12/27/14 1105  . nystatin ointment (MYCOSTATIN)   Topical TID Harrie Foreman, MD      . pantoprazole (PROTONIX) EC tablet 40 mg  40 mg Oral QAC breakfast Epifanio Lesches, MD   40 mg at 12/28/14 0754  . predniSONE (DELTASONE) tablet 20 mg  20 mg Oral Q breakfast Epifanio Lesches, MD   20 mg at 12/28/14 0754  . sodium chloride 0.9 % injection 3 mL  3 mL Intravenous Q12H Harrie Foreman, MD   3 mL at 12/28/14 1105  . tamsulosin (FLOMAX) capsule 0.4 mg  0.4 mg Oral Daily Harrie Foreman, MD    0.4 mg at 12/28/14 1104    OBJECTIVE: Filed Vitals:   12/28/14 1422  BP: 117/75  Pulse: 75  Temp: 97.8 F (36.6 C)  Resp:      Body mass index is 39.52 kg/(m^2).    ECOG FS:3 - Symptomatic, >50% confined to bed  General: Well-developed, well-nourished, no acute distress. Eyes: Pink conjunctiva, anicteric sclera. Lungs: Clear to auscultation bilaterally. Heart: Regular rate and rhythm. No rubs, murmurs, or gallops. Abdomen: Soft, nontender, nondistended. No organomegaly noted, normoactive bowel sounds. Musculoskeletal: Bilateral lower extremity edema.  Neuro: Alert, answering all questions appropriately. Cranial nerves grossly intact. Skin: No rashes or petechiae noted. Psych: Normal affect.   LAB RESULTS:  Lab Results  Component Value Date   NA 142 12/26/2014   K 4.2 12/27/2014   CL 107 12/26/2014   CO2 30 12/26/2014   GLUCOSE 133* 12/26/2014   BUN 25* 12/26/2014   CREATININE 1.07 12/28/2014   CALCIUM 7.7* 12/26/2014   PROT 6.5 12/22/2014   ALBUMIN 2.9* 12/22/2014   AST 24 12/22/2014   ALT 11* 12/22/2014   ALKPHOS 52 12/22/2014   BILITOT 1.1 12/22/2014   GFRNONAA >60 12/28/2014   GFRAA >60 12/28/2014    Lab Results  Component Value Date   WBC 11.5* 12/26/2014   NEUTROABS 5.4 12/22/2014   HGB 11.8* 12/26/2014   HCT 37.0* 12/26/2014   MCV 85.8 12/26/2014   PLT 187 12/26/2014     STUDIES: Dg Chest 1 View  12/26/2014   CLINICAL DATA:  Pleural effusion  EXAM: CHEST 1 VIEW  COMPARISON:  12/24/2014  FINDINGS: Stable enlarged cardiac silhouette. There is improvement in RIGHT basilar atelectasis. Small bilateral effusions.  IMPRESSION: Improved RIGHT basilar atelectasis.  Small effusions and cardiomegaly.   Electronically Signed   By: Suzy Bouchard M.D.   On: 12/26/2014 07:35   Dg Chest 1 View  12/24/2014   CLINICAL DATA:  Post thoracentesis  EXAM: CHEST 1 VIEW  COMPARISON:  CTA chest dated 12/15/2014  FINDINGS: No pneumothorax is seen.  Small right pleural  effusion. Patchy right lower lobe opacity, likely atelectasis.  Cardiomegaly.  IMPRESSION: No pneumothorax is seen.  Small right pleural effusion.  Patchy right lower lobe opacity, likely atelectasis.   Electronically Signed   By: Julian Hy M.D.   On: 12/24/2014 10:14   Dg Knee 1-2 Views Left  12/23/2014   CLINICAL DATA:  LEFT knee pain for 1 week. No known injury. Unable to bear weight.  EXAM: LEFT KNEE - 1-2 VIEW  COMPARISON:  None.  FINDINGS: In No fracture of the proximal tibia or distal femur. Patella  is normal. Small suprapatellar joint effusion.  IMPRESSION: Small joint effusion.  No fracture or dislocation.   Electronically Signed   By: Suzy Bouchard M.D.   On: 12/23/2014 15:39   Ct Angio Chest Pe W/cm &/or Wo Cm  12/23/2014   CLINICAL DATA:  Dyspnea and metastatic melanoma  EXAM: CT ANGIOGRAPHY CHEST WITH CONTRAST  TECHNIQUE: Multidetector CT imaging of the chest was performed using the standard protocol during bolus administration of intravenous contrast. Multiplanar CT image reconstructions and MIPs were obtained to evaluate the vascular anatomy.  CONTRAST:  13m OMNIPAQUE IOHEXOL 350 MG/ML SOLN  COMPARISON:  11/13/2014 PET-CT  FINDINGS: THORACIC INLET/BODY WALL:  No acute abnormality.  MEDIASTINUM:  Chronic cardiomegaly. No pericardial effusion. Diffuse atherosclerosis, including the coronary arteries. No evidence of pulmonary embolism. No acute aortic finding.  Stable left superior mediastinal nodule, 24 mm in maximal diameter.  LUNG WINDOWS:  Moderate to large right pleural effusion, approximately 40% of the right chest volume, with multi segment atelectasis. No indication or pneumonia.  Stable 14 mm left upper lobe metastasis. No new metastatic focus identified. No pleural nodularity.  UPPER ABDOMEN:  Nonspecific haziness of fat around the hepatoduodenal ligament, similar to 11/13/2014 study. Noted negative liver function tests. The patient is status post cholecystectomy.  3 cm right  adrenal nodule, previously characterized as adenoma.  OSSEOUS:  No acute fracture.  No suspicious lytic or blastic lesions.  Review of the MIP images confirms the above findings.  IMPRESSION: 1. No evidence of pulmonary embolism. 2. Moderate to large right pleural effusion, significantly increased from 11/13/2014 PET, with multi segment atelectasis. 3. Stable mediastinal and left upper lobe metastases.   Electronically Signed   By: JMonte FantasiaM.D.   On: 12/23/2014 07:01   Mr Brain Wo Contrast  12/23/2014   CLINICAL DATA:  74year old male with metastatic melanoma, 1 week inability to walk and severe lower extremity pain. Subsequent encounter. PET-CT 11/13/2014.  EXAM: MRI HEAD WITHOUT CONTRAST  TECHNIQUE: Multiplanar, multiecho pulse sequences of the brain and surrounding structures were obtained without intravenous contrast.  COMPARISON:  Brain MRI 03/10/2014.  FINDINGS: The examination had to be discontinued prior to completion due to patient back Pain.  Postcontrast imaging was not obtained.  Stable cerebral volume. Major intracranial vascular flow voids are stable. No restricted diffusion to suggest acute infarct. There appears to be T2 shine through in the right centrum semiovale (series 100, image 40). Overall gray and white matter signal appears stable since 2015. No areas of intracranial mass effect or definite vasogenic edema are identified. No acute intracranial hemorrhage identified. No ventriculomegaly. Pituitary, cervicomedullary junction and visualized upper cervical spine appears stable.  New bilateral mastoid effusions. Negative nasopharynx. Paranasal sinuses remain clear. Orbits soft tissues appear normal postoperative changes to the posterior right scalp at the vertex appear stable. Small left lateral scalp benign lipoma incidentally noted.  IMPRESSION: 1. The examination had to be discontinued prior to completion. No postcontrast imaging could be obtained. 2. No definite metastatic disease  to the brain or acute intracranial abnormality. 3. New bilateral mastoid effusions, most often postinflammatory.   Electronically Signed   By: HGenevie AnnM.D.   On: 12/23/2014 15:38   UKoreaVenous Img Lower Bilateral  12/28/2014   CLINICAL DATA:  Bilateral leg pain and edema. History of lung cancer. Evaluate for DVT.  EXAM: BILATERAL LOWER EXTREMITY VENOUS DOPPLER ULTRASOUND  TECHNIQUE: Gray-scale sonography with graded compression, as well as color Doppler and duplex ultrasound were performed to evaluate  the lower extremity deep venous systems from the level of the common femoral vein and including the common femoral, femoral, profunda femoral, popliteal and calf veins including the posterior tibial, peroneal and gastrocnemius veins when visible. The superficial great saphenous vein was also interrogated. Spectral Doppler was utilized to evaluate flow at rest and with distal augmentation maneuvers in the common femoral, femoral and popliteal veins.  COMPARISON:  None.  FINDINGS: RIGHT LOWER EXTREMITY  Common Femoral Vein: No evidence of thrombus. Normal compressibility, respiratory phasicity and response to augmentation.  Saphenofemoral Junction: No evidence of thrombus. Normal compressibility and flow on color Doppler imaging.  Profunda Femoral Vein: No evidence of thrombus. Normal compressibility and flow on color Doppler imaging.  Femoral Vein: No evidence of thrombus. Normal compressibility, respiratory phasicity and response to augmentation.  Popliteal Vein: No evidence of thrombus. Normal compressibility, respiratory phasicity and response to augmentation.  Calf Veins: No evidence of thrombus. Normal compressibility and flow on color Doppler imaging.  Superficial Great Saphenous Vein: No evidence of thrombus. Normal compressibility and flow on color Doppler imaging.  Venous Reflux:  None.  Other Findings:  None.  LEFT LOWER EXTREMITY  Common Femoral Vein: No evidence of thrombus. Normal compressibility,  respiratory phasicity and response to augmentation.  Saphenofemoral Junction: No evidence of thrombus. Normal compressibility and flow on color Doppler imaging.  Profunda Femoral Vein: No evidence of thrombus. Normal compressibility and flow on color Doppler imaging.  Femoral Vein: No evidence of thrombus. Normal compressibility, respiratory phasicity and response to augmentation.  Popliteal Vein: No evidence of thrombus. Normal compressibility, respiratory phasicity and response to augmentation.  Calf Veins: No evidence of thrombus. Normal compressibility and flow on color Doppler imaging.  Superficial Great Saphenous Vein: No evidence of thrombus. Normal compressibility and flow on color Doppler imaging.  Venous Reflux:  None.  Other Findings:  None.  IMPRESSION: No evidence of DVT within either lower extremity.   Electronically Signed   By: Sandi Mariscal M.D.   On: 12/28/2014 14:19   Dg Chest Port 1 View  12/22/2014   CLINICAL DATA:  Patient has lung cancer with shortness of breath.  EXAM: PORTABLE CHEST 1 VIEW  COMPARISON:  May 31, 2014  FINDINGS: The heart size is enlarged. The mediastinal contour is stable. There is consolidation of the right mid to lung base. The left lung is clear. No acute abnormalities identified within the visualized bones.  IMPRESSION: Consolidation of the right mid to lung base.   Electronically Signed   By: Abelardo Diesel M.D.   On: 12/22/2014 22:31   US Thoracentesis Asp Pleural Space W/img Guide  12/24/2014   CLINICAL DATA:  Recurrent right pleural effusion. Metastatic melanoma.  EXAM: EXAM THORACENTESIS WITH ULTRASOUND  TECHNIQUE: The procedure, risks (including but not limited to bleeding, infection, organ damage ), benefits, and alternatives were explained to the patient. Questions regarding the procedure were encouraged and answered. The patient understands and consents to the procedure.  Survey ultrasound of the right hemithorax was performed and an appropriate skin entry  site was localized. Site was marked, prepped with Betadine, draped in usual sterile fashion, infiltrated locally with 1% lidocaine.  The Saf-T-Centesis needle was advanced into the pleural space. Thin clear yellow fluid returned. 2.5 L was removed. Post procedure imaging shows no residual fluid. The patient tolerated procedure well.  COMPLICATIONS: COMPLICATIONS None immediate  IMPRESSION: Technically successful ultrasound-guided right thoracentesis. Follow-up chest radiograph pending.   Electronically Signed   By: Eden Emms.D.  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 Severe leg pain.  PLAN:    1. Melanoma: PET results from November 13, 2014 were reviewed independently and reported essentially stable disease. Patient also has now completed XRT to the lesion at his L5 vertebrae. Proceed with previously scheduled Nivolumab today. Patient's next treatment will be scheduled in 2 weeks on February 03, 2015. MRI the brain is negative. I suspect his pleural effusion is malignant, but cytology is negative for malignant cells.  2. Leg pain: Improving. Possibly from gout flare. MRI the brain negative, could not perform MRI of the lumbar spine. Will hold on this MRI for now, but if no significant improvement by next week would consider rescheduling. 3. Shortness of breath: Secondary to pleural effusion. Patient reports he had thoracentesis in [place] removing 1.8 L. His most recent thoracentesis removed 2.5 L. Cytology did not reveal malignant cells. If reaccumulation occurs over the next week, patient may benefit from a Pleurx catheter.  4. Lower extremity edema: Ultrasound negative for DVT.   Will follow  Melanoma of skin   Staging form: Melanoma of the Skin, AJCC 7th Edition     Clinical stage from 08/30/2014: Stage IV ([place], NX, M1c) - Signed by Lloyd Huger, MD on 08/30/2014   Lloyd Huger, MD   12/28/2014 4:28  PM

## 2014-12-28 NOTE — Progress Notes (Signed)
Initial Nutrition Assessment   INTERVENTION:   Meals and Snacks: Cater to patient preferences Education: Educated Eddie Elliott on "Low-Purine Nutrition Therapy" provided by the Academy of [employer] and Dietetics for gout symptoms to Eddie Elliott wife. Education included foods recommended and foods not recommended. Wife thankful for information and will review with Eddie Elliott when able. Teach back method used. Expect good compliance.   NUTRITION DIAGNOSIS:   Food and nutrition related knowledge deficit related to acute illness as evidenced by per patient/family report.  GOAL:   Patient will meet greater than or equal to 90% of their needs  MONITOR:    (Energy Intake, Electrolyte and Renal Profile, Digestive sytem)  REASON FOR ASSESSMENT:   LOS    ASSESSMENT:    Eddie Elliott admitted with healthcare associated pna and stage IV melanoma. Eddie Elliott with knee pain secondary to swelling per xray likely secondary to gout.   Past Medical History  Diagnosis Date  . 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 Hypertension   . Gout   . CHF (congestive heart failure)     Diet Order:  Diet regular Room service appropriate?: Yes; Fluid consistency:: Thin    Current Nutrition: Eddie Elliott with good appetite eating 81% of meals on average.  Food/Nutrition-Related History: Eddie Elliott with good appetite PTA   Medications: Lasix, Colace, protonix, prednisone  Electrolyte/Renal Profile and Glucose Profile:   Recent Labs Lab 12/24/14 0541 12/25/14 0431 12/26/14 0557 12/27/14 1325 12/28/14 0419  NA 140 141 142  --   --   K 3.8 4.4 4.3 4.2  --   CL 103 104 107  --   --   CO2 30 29 30   --   --   BUN 20 17 25*  --   --   CREATININE 1.33* 1.11 1.09  --  1.07  CALCIUM 7.5* 7.8* 7.7*  --   --   MG  --   --   --  1.2*  --   GLUCOSE 98 154* 133*  --   --    Protein Profile:  Recent Labs Lab 12/22/14 2140  ALBUMIN 2.9*    Gastrointestinal Profile: Last BM:   12/28/2014   Weight Change: Eddie Elliott with weight gain per CHL    Skin:  Reviewed, no issues  Height:   Ht Readings from Last 1 Encounters:  12/23/14 5\' 7"  (1.702 m)    Weight:   Wt Readings from Last 1 Encounters:  12/28/14 252 lb 6.4 oz (114.488 kg)    Wt Readings from Last 10 Encounters:  12/28/14 252 lb 6.4 oz (114.488 kg)  12/23/14 249 lb (112.946 kg)  11/09/14 251 lb 5.2 oz (114 kg)  10/26/14 252 lb 10.4 oz (114.6 kg)  10/12/14 235 lb 3.7 oz (106.7 kg)  09/29/14 239 lb 6.7 oz (108.6 kg)  09/14/14 229 lb 15 oz (104.3 kg)  08/31/14 221 lb 9 oz (100.5 kg)     BMI:  Body mass index is 39.52 kg/(m^2).   EDUCATION NEEDS:   Education needs addressed   LOW Care Level  Dwyane Luo, [place], LDN Pager [phone]

## 2014-12-28 NOTE — Plan of Care (Signed)
Problem: Discharge Progression Outcomes Goal: Other Discharge Outcomes/Goals Outcome: Progressing Plan of care progress to goal for: 1. Discharge Plan:PT suggest SNF at discharge. 2. Pain: c/o left leg pain improved with PRN pain medication.  3. Hemodynamically Stable: VSS, Afebrile this shift, antibiotics continue, labs improving continue to monitor.       4. Complications controlled- pt is scheduled for [facility] today (Outpatient). Pt states that Dr. Maryjane Hurter has been in and stated that the may receive this inpatient.   5. Diet: tolerating, no c/o nausea or vomiting this shift.       6. Activity:Bedrest. Pt is unable to tolerate weight on left leg.

## 2014-12-28 NOTE — Progress Notes (Signed)
Physical Therapy Treatment Patient Details Name: Eddie Elliott MRN: [id number] DOB: February 11, 1941 Today's Date: 12/28/2014    History of Present Illness Patient is a 74 y/o male that lives 11 months of the year in [place], noted to have shortness of breath and returned home for evaluation with oncology. Patient noted to have history of left upper lobe and mediastinal mets. Patient developed L knee pain on his flight home and has not ambulated any significant distance since. He now states he has knee pain bilaterally. He has a history of gout, which he states he has not had a bout of in years.     PT Comments    Patient continues to state he is making significant improvements in pain, strength, and mobility however he remains unable to provide sufficient effort for standing secondary to LLE pain. PT re-assessed patient and found pain is reproduced with knee extension both passively and actively from supine, sitting, and standing as he approaches terminal knee extension. Patient is able to provide sufficient effort to perform mini sit to stands to scoot up and down in bed with no assistance. Pain origin seems to be lateral aspect of calf. Patient seems to have cooler L calf than R and noted to have increased pitting on L than R after removal of socks. No discernable differences in nail color/composition noted from side to side. PT is concerned of non-gouty origin of pain on the L side as patient has complained of this for several days now with no change in intensity and non-joint line pain location. Patient participates fully in session, to his tolerance. PT found no reproducible pain with pressure applied to calf, distal hamstring, or joint line on this visit. Skilled acute PT services are indicated to address the above deficits.   Follow Up Recommendations  SNF     Equipment Recommendations       Recommendations for Other Services       Precautions / Restrictions  Precautions Precautions: Fall Restrictions Weight Bearing Restrictions: No    Mobility  Bed Mobility Overal bed mobility: Needs Assistance Bed Mobility: Supine to Sit;Sit to Supine     Supine to sit: Supervision Sit to supine: Supervision   General bed mobility comments: Patient is able to complete sit <--> supine transfer with no cues or increase in pain.   Transfers                 General transfer comment: Attempted sit to stand transfer x 1 with RW, patient began experiencing significant pain in the lateral aspect of his L calf near the fibula. Max A x1 provided, however L knee unable to support him in extension secondary to pain and deferred further attempts.   Ambulation/Gait                 Stairs            Wheelchair Mobility    Modified Rankin (Stroke Patients Only)       Balance Overall balance assessment:  (Patient is independent with seated balance and can provide mini scoots from sitting up and down the bed independently. )                                  Cognition Arousal/Alertness: Awake/alert Behavior During Therapy: WFL for tasks assessed/performed Overall Cognitive Status: Within Functional Limits for tasks assessed  Exercises General Exercises - Lower Extremity Ankle Circles/Pumps: AROM;Both;Seated;10 reps Heel Slides: AROM;10 reps;Left Straight Leg Raises: AROM;Right;10 reps Toe Raises: AROM;Both;5 reps;Seated Other Exercises Other Exercises: Mini sit to stands for scooting up and down in the bed x 5 with no assistance required, maintained ~ 50+ degrees of knee flexion with no pain.  Other Exercises: Short arc quad attempted with pillow under L knee, painful at end range and discontinued  Other Exercises: Assessed MMT strength in sitting of PF/DF/Inversion/Eversion of LLE, all 5/5 and non-painful.     General Comments General comments (skin integrity, edema, etc.): PT noted  decreased temperature in calves bilaterally L>R, and noted increased pitting on LLE compared to R after taking socks off. Patient continues to point at lateral aspect of calf, just below fibular head as painful area and states it radiates up his leg. Pain with knee extension actively/passively. No other reproducible methods.       Pertinent Vitals/Pain Pain Assessment:  (Patient only experiences pain with terminal R knee extension either passively or actively in supine, sitting, and attempts at standing,)    Home Living                      Prior Function            PT Goals (current goals can now be found in the care plan section) Acute Rehab PT Goals Patient Stated Goal: To return home and go back to [place] PT Goal Formulation: With patient Time For Goal Achievement: 01/08/15 Potential to Achieve Goals: Good Progress towards PT goals: Progressing toward goals    Frequency  Min 2X/week    PT Plan Current plan remains appropriate    Co-evaluation             End of Session Equipment Utilized During Treatment: Gait belt;Oxygen Activity Tolerance: Patient limited by pain Patient left: in bed;with bed alarm set;with call bell/phone within reach;with SCD's reapplied;with family/visitor present     Time: 7564-3329 PT Time Calculation (min) (ACUTE ONLY): 23 min  Charges:  $Therapeutic Exercise: 8-22 mins $Therapeutic Activity: 8-22 mins                    G Codes:      Kerman Passey, PT, DPT    12/28/2014, 10:49 AM

## 2014-12-28 NOTE — Plan of Care (Signed)
Pt reported to Dr. Grayland Ormond that knee pain and breathing has improved.  But when he wked w/PT today - experienced severe pain that keeps him from bearing weight. PT and I noticed L leg was slightly cooler than R and pitting edema seems more than yesterday.  Dr ordered U/S Bilateral LE.  Negative for DVT.   Did require morphine 1x and he reported that resolved pain.  Pt had immunotherapy today - Opdivo.  Pt has never had negative response from this but was experiencing N&V at end of shift.  Wife at bedside.

## 2014-12-28 NOTE — Care Management Important Message (Signed)
Important Message  Patient Details  Name: Eddie Elliott MRN: [id number] Date of Birth: 30-Aug-1940   Medicare Important Message Given:  Yes-third notification given    Shelbie Ammons, RN 12/28/2014, 10:32 AM

## 2014-12-28 NOTE — Clinical Social Work Note (Addendum)
Clinical Social Work Assessment  Patient Details  Name: Eddie Elliott MRN: [id number] Date of Birth: 05-02-1940  Date of referral:  12/28/14               Reason for consult:  Facility Placement                Permission sought to share information with:  Family Supports Permission granted to share information::  Yes, Verbal Permission Granted  Name::      (Wife- Eddie Elliott)  Agency::    Relationship::    wife of 22 years    Contact Information:    [phone]  Housing/Transportation Living arrangements for the past 2 months:  Single Family Home Source of Information:  Spouse Patient Interpreter Needed:  None Criminal Activity/Legal Involvement Pertinent to Current Situation/Hospitalization:  No - Comment as needed Significant Relationships:  Adult Children, Spouse Lives with:  Other (Comment), Spouse (Patient goes back and forthe between here and  [place] where they have a home.  Patient's wife had an air ambulance bring him back to [place] when he became sick recently- he has been here with friends/wife taking care of him. ) Do you feel safe going back to the place where you live?  No Need for family participation in patient care:  Yes (Comment)  Care giving concerns:  Patient is weak and unable to be cared for at home- PT is working with patient;  Per PT: " Attempted sit to stand transfer x 1 with RW, patient began experiencing significant pain in the lateral aspect of his L calf near the fibula. Max A x1 provided, however L knee unable to support him in extension secondary to pain and deferred further attempt". He and wife are agreeable to SNF as she in working at [employer] and he will not be safe at home alone.     Social Worker assessment / plan:  CSW discussed SNF coverage and process with patient and his wife (he was asleep/eyes closed during most of visit).  CSW will complete FL2 and PASARR for SNF search and seek bed options- wife prefers something close to her work at [employer]- [employer] provided.   Employment status:  Retired Forensic scientist:  [employer] PT Recommendations:  [place] / Referral to community resources:  [place]  Patient/Family's Response to care:  Wife advised he spends most of his time in [place] but receives his medical care here. They are pleased and appreciative of the care and consideration by medical team.  Awaiting injection/treatment to be given today.   Patient/Family's Understanding of and Emotional Response to Diagnosis, Current Treatment, and Prognosis:  Wife reports he has been getting an injection/treatment once monthly but will now begin to get it 2 times a month based on patient's request and MD agreement. They are optimistic this will help treat his melanoma.   Emotional Assessment Appearance:  Developmentally appropriate Attitude/Demeanor/Rapport:  Unable to Assess Affect (typically observed):  Quiet Orientation:  Oriented to Self, Oriented to Place, Oriented to  Time, Oriented to Situation Alcohol / Substance use:  Not Applicable Psych involvement (Current and /or in the community):  No (Comment)  Discharge Needs  Concerns to be addressed:  Discharge Planning Concerns Readmission within the last 30 days:  No Current discharge risk:  Physical Impairment, Chronically ill Barriers to Discharge:  No Barriers Identified   Ludwig Clarks, LCSW 12/28/2014, 1:38 PM

## 2014-12-28 NOTE — Care Management (Addendum)
Admitted to [facility] with the diagnosis of HealthCare Associated Pneumonia (stage IV melamona). Wife is Butch Penny [phone]). Sees Dr. Grayland Ormond at the [facility]. Fair appetite. Lost weight lately. Takes care of all basic and instrumental activities of daily living himself. Daughter at the bedside. An important Message from Medicare about your Rights issued. Shelbie Ammons RN MSN Care Management [phone]

## 2014-12-28 NOTE — Clinical Social Work Placement (Signed)
   CLINICAL SOCIAL WORK PLACEMENT  NOTE  Date:  12/28/2014  Patient Details  Name: Eddie Elliott MRN: [id number] Date of Birth: 03-16-1941  Clinical Social Work is seeking post-discharge placement for this patient at the [place] level of care (*CSW will initial, date and re-position this form in  chart as items are completed):  Yes   Patient/family provided with [facility] Work Department's list of facilities offering this level of care within the geographic area requested by the patient (or if unable, by the patient's family).  Yes   Patient/family informed of their freedom to choose among providers that offer the needed level of care, that participate in Medicare, Medicaid or managed care program needed by the patient, have an available bed and are willing to accept the patient.  Yes   Patient/family informed of [facility]'s ownership interest in [facility] and [facility], as well as of the fact that they are under no obligation to receive care at these facilities.  PASRR submitted to EDS on 12/28/14     PASRR number received on 12/28/14     Existing PASRR number confirmed on       FL2 transmitted to all facilities in geographic area requested by pt/family on 12/28/14     FL2 transmitted to all facilities within larger geographic area on       Patient informed that his/her managed care company has contracts with or will negotiate with certain facilities, including the following:            Patient/family informed of bed offers received.  Patient chooses bed at       Physician recommends and patient chooses bed at      Patient to be transferred to   on  .  Patient to be transferred to facility by       Patient family notified on   of transfer.  Name of family member notified:        PHYSICIAN Please sign FL2     Additional Comment:    _______________________________________________ Ludwig Clarks,  LCSW 12/28/2014, 1:51 PM

## 2014-12-28 NOTE — Progress Notes (Signed)
[facility] [place] NAME: Eddie Elliott    MR#:  [id number]  DATE OF BIRTH:  07/02/1940  SUBJECTIVE: had bad day with knee  Pain yesterday.started on prednisone,allopurinol.today feels better.less knee pain,eager to work with PT,interested with PT.  CHIEF COMPLAINT:   Chief Complaint  Patient presents with  . Shortness of Breath   Pain in his knees is improved. Feels a little bit better. Shortness of breath is improved.  REVIEW OF SYSTEMS:    Review of Systems  Constitutional: Negative for fever and chills.  HENT: Negative for congestion and tinnitus.   Eyes: Negative for blurred vision and double vision.  Respiratory: Negative for cough, shortness of breath (improved) and wheezing.   Cardiovascular: Negative for chest pain, orthopnea and PND.  Gastrointestinal: Negative for nausea, vomiting, abdominal pain and diarrhea.  Genitourinary: Negative for dysuria and hematuria.  Musculoskeletal: Positive for joint pain (left knee).       Left knee pain improved,range of motion improved.  Neurological: Positive for weakness (generalized). Negative for dizziness, sensory change and focal weakness.  All other systems reviewed and are negative.   Nutrition: Regular Tolerating Diet: Yes Tolerating PT: Await evaluation   DRUG ALLERGIES:   Allergies  Allergen Reactions  . No Known Allergies     VITALS:  Blood pressure 109/70, pulse 88, temperature 98.2 F (36.8 C), temperature source Oral, resp. rate 18, height 5' 7"  (1.702 m), weight 114.488 kg (252 lb 6.4 oz), SpO2 97 %.  PHYSICAL EXAMINATION:   Physical Exam  GENERAL:  74 y.o.-year-old obese patient sitting up in the bed with no acute distress.  EYES: Pupils equal, round, reactive to light and accommodation. No scleral icterus. Extraocular muscles intact.  HEENT: Head atraumatic, normocephalic. Oropharynx and nasopharynx clear.  NECK:  Supple, no jugular venous  distention. No thyroid enlargement, no tenderness.  LUNGS: Good air entry bilaterally, no wheezing, rales, rhonchi. No use of accessory muscles of respiration.  CARDIOVASCULAR: S1, S2 normal. No murmurs, rubs, or gallops.  ABDOMEN: Soft, nontender, nondistended. Bowel sounds present. No organomegaly or mass.  EXTREMITIES: No cyanosis, clubbing, +1 pitting edema from the knees to the ankles bilaterally. chronic changes of venous stasis on lower extremity is bilaterally. Left knee slightly warm but no redness, no tenderness to palpation. His pain is mainly around the muscle below the  knee. NEUROLOGIC: Cranial nerves II through XII are intact. No focal Motor or sensory deficits b/l.  Globally weak PSYCHIATRIC: The patient is alert and oriented x 3. Good affect SKIN: No obvious rash, lesion, or ulcer.    LABORATORY PANEL:   CBC  Recent Labs Lab 12/26/14 0557  WBC 11.5*  HGB 11.8*  HCT 37.0*  PLT 187   ------------------------------------------------------------------------------------------------------------------  Chemistries   Recent Labs Lab 12/22/14 2140  12/26/14 0557 12/27/14 1325 12/28/14 0419  NA 137  < > 142  --   --   K 3.9  < > 4.3 4.2  --   CL 97*  < > 107  --   --   CO2 31  < > 30  --   --   GLUCOSE 89  < > 133*  --   --   BUN 22*  < > 25*  --   --   CREATININE 1.30*  < > 1.09  --  1.07  CALCIUM 8.1*  < > 7.7*  --   --   MG  --   --   --  1.2*  --   AST 24  --   --   --   --   ALT 11*  --   --   --   --   ALKPHOS 52  --   --   --   --   BILITOT 1.1  --   --   --   --   < > = values in this interval not displayed. ------------------------------------------------------------------------------------------------------------------  Cardiac Enzymes  Recent Labs Lab 12/22/14 2140  TROPONINI <0.03   ------------------------------------------------------------------------------------------------------------------  RADIOLOGY:  No results  found.   ASSESSMENT AND PLAN:   74 year old male with past medical history of GI bleed, stage IV melanoma, history of gout, history of CHF, hypertension who presented to the hospital due to shortness of breath and having difficulty walking.   #1 shortness of breath-most likely due to the right-sided pleural effusion which was seen on the CT scan of the chest. no evidence of pulmonary embolism.  -Possible underlying pneumonia, continue empiric ceftriaxone/Zithromax. Already finished 5 days of antibiotics. We will discontinue the antibiotics at this time. -Status post ultrasound-guided thoracentesis 9/29 with 2.5 L of fluid removed. Fluid analysis consistent with a transudative pleural effusion.  Pathology report is pending.-    #2 back/knee pain and difficulty walking-X-ray of the left knee did show some swelling but no evidence of acute bony abnormality. -acute gout: Off left knee; Continue colchicine, prednisone and allopurinol added yesterday. Patient is feeling the symptomatic improvement. Continue them. ESR and  CPK are normal.  #3 history of chronic diastolic CHF-clinically patient is not in congestive heart failure. -Continue Coreg, Lasix  #4 history of chronic atrial fibrillation-currently rate controlled and continue Coreg.   - Discussed with Dr. Saralyn Pilar and due to pt's follow up issues from going back and forth from [place] he is not on long term anticoagulation and cont. ASA for now.  Discussed with him and o.k. To hold Propafenone for now.    #5 BPH-continue Flomax.  #6 anxiety-continue as needed Xanax.  #7 history of stage IV melanoma-patient being followed by oncology by Dr. Grayland Ormond.   Patient is interested in port placement, for chemotherapy today. #8 hypomagnesemia causing muscle cramps: Replaced  the magnesium.  All the records are reviewed and case discussed with Care Management/Social Workerr. Management plans discussed with the patient, family and they are in  agreement.  CODE STATUS: Full  DVT Prophylaxis: Teds and SCDs  TOTAL TIME TAKING CARE OF THIS PATIENT: 25 minutes.   POSSIBLE D/C IN 2-3 DAYS, DEPENDING ON CLINICAL CONDITION.   Epifanio Lesches M.D on 12/28/2014 at 9:25 AM  Between 7am to 6pm - Pager - [phone]  After 6pm go to [email or web address] - password [email or web address] [facility]  Office  [phone]  CC: Primary care physician; Juluis Pitch, MD

## 2014-12-29 LAB — MAGNESIUM: MAGNESIUM: 1.7 mg/dL (ref 1.7–2.4)

## 2014-12-29 MED ORDER — PREDNISONE 50 MG PO TABS
50.0000 mg | ORAL_TABLET | Freq: Every day | ORAL | Status: DC
  Administered 2014-12-30: 50 mg via ORAL
  Filled 2014-12-29: qty 1

## 2014-12-29 MED ORDER — LOPERAMIDE HCL 2 MG PO CAPS
2.0000 mg | ORAL_CAPSULE | Freq: Four times a day (QID) | ORAL | Status: DC | PRN
  Administered 2014-12-29 – 2014-12-30 (×4): 2 mg via ORAL
  Filled 2014-12-29 (×4): qty 1

## 2014-12-29 NOTE — Progress Notes (Signed)
Pt agreeable to SNF at [facility]. SNF bed ready on Wednesday if Pt is ready for dc.  CSW will continue to follow. Wife at bedside and agreeable to plan as well.   Toma Copier, [place]

## 2014-12-29 NOTE — Plan of Care (Signed)
Problem: Discharge Progression Outcomes Goal: Other Discharge Outcomes/Goals Outcome: Progressing Plan of Care Progress to Goal:   Pt was throwing up at the beginning of shift. Paged and spoke with Dr. Leslye Peer about zofran order. Zofran was ordered and administered. Pt has reported relief. No emesis the rest of shift. Pt has multiple loose stool. Paged and spoke with Dr. Jannifer Franklin, C diff protocol initiated. Pt was negative. Paged and spoke with Dr. Jannifer Franklin and imoduim was ordered and administered once during shift. No other signs of distress noted. Will continue to monitor.

## 2014-12-29 NOTE — Progress Notes (Signed)
Several bowel movements noted, c diff is negative. Dr. Ree Kida and colace was discontinued.

## 2014-12-29 NOTE — Progress Notes (Signed)
   12/29/14 1430  Clinical Encounter Type  Visited With Patient and family together  Visit Type Follow-up  Provided pastoral presence and support to patient and his wife.  [place] [phone]

## 2014-12-29 NOTE — Progress Notes (Signed)
[facility] [place] NAME: Eddie Elliott    MR#:  [id number]  DATE OF BIRTH:  11-26-1940  SUBJECTIVE: Significant diarrhea. Thinks is due to call sent. Stool for C. difficile is negative. Hasdnausea yesterday after the chemotherapy. Resolved now. Left knee pain still there, unable to put the weight on it.   CHIEF COMPLAINT:   Chief Complaint  Patient presents with  . Shortness of Breath   this is 74 year old male with shortness of breath found to have pleural effusion. Patient had a thoracocentesis done 21 in [place] about 1.8 L drained and the here more than 2 L drained. The pleural fluid analysis is negative for any malignancy yet. He has a history of melanoma, following up with Dr. Raul Del and in getting chemotherapy for that. Right now main problem is his left flank area with ambulatory difficulties.   REVIEW OF SYSTEMS:    Review of Systems  Constitutional: Negative for fever and chills.  HENT: Negative for congestion and tinnitus.   Eyes: Negative for blurred vision and double vision.  Respiratory: Negative for cough, shortness of breath (improved) and wheezing.   Cardiovascular: Negative for chest pain, orthopnea and PND.  Gastrointestinal: Positive for diarrhea. Negative for nausea, vomiting and abdominal pain.  Genitourinary: Negative for dysuria and hematuria.  Musculoskeletal: Positive for joint pain (left knee).       Left knee pain improved,range of motion improved.  Neurological: Positive for weakness (generalized). Negative for dizziness, sensory change and focal weakness.  All other systems reviewed and are negative.   Nutrition: Regular Tolerating Diet: Yes Tolerating PT: Await evaluation   DRUG ALLERGIES:   Allergies  Allergen Reactions  . No Known Allergies     VITALS:  Blood pressure 101/59, pulse 88, temperature 97.8 F (36.6 C), temperature source Oral, resp. rate 22, height 5\' 7"  (1.702 m),  weight 114.76 kg (253 lb), SpO2 95 %.  PHYSICAL EXAMINATION:   Physical Exam  GENERAL:  74 y.o.-year-old obese patient sitting up in the bed with no acute distress.  EYES: Pupils equal, round, reactive to light and accommodation. No scleral icterus. Extraocular muscles intact.  HEENT: Head atraumatic, normocephalic. Oropharynx and nasopharynx clear.  NECK:  Supple, no jugular venous distention. No thyroid enlargement, no tenderness.  LUNGS: Good air entry bilaterally, no wheezing, rales, rhonchi. No use of accessory muscles of respiration.  CARDIOVASCULAR: S1, S2 normal. No murmurs, rubs, or gallops.  ABDOMEN: Soft, nontender, nondistended. Bowel sounds present. No organomegaly or mass.  EXTREMITIES: No cyanosis, clubbing, +1 pitting edema from the knees to the ankles bilaterally. chronic changes of venous stasis on lower extremity is bilaterally. Left knee slightly warm but no redness, no tenderness to palpation. His pain is mainly around the muscle below the  knee. NEUROLOGIC: Cranial nerves II through XII are intact. No focal Motor or sensory deficits b/l.  Globally weak PSYCHIATRIC: The patient is alert and oriented x 3. Good affect SKIN: No obvious rash, lesion, or ulcer.    LABORATORY PANEL:   CBC  Recent Labs Lab 12/26/14 0557  WBC 11.5*  HGB 11.8*  HCT 37.0*  PLT 187   ------------------------------------------------------------------------------------------------------------------  Chemistries   Recent Labs Lab 12/22/14 2140  12/26/14 0557  12/27/14 1325 12/28/14 0419 12/29/14 0536  NA 137  < > 142  --   --   --   --   K 3.9  < > 4.3  --  4.2  --   --  CL 97*  < > 107  --   --   --   --   CO2 31  < > 30  --   --   --   --   GLUCOSE 89  < > 133*  --   --   --   --   BUN 22*  < > 25*  --   --   --   --   CREATININE 1.30*  < > 1.09  --   --  1.07  --   CALCIUM 8.1*  < > 7.7*  --   --   --   --   MG  --   --   --   < > 1.2*  --  1.7  AST 24  --   --   --   --    --   --   ALT 11*  --   --   --   --   --   --   ALKPHOS 52  --   --   --   --   --   --   BILITOT 1.1  --   --   --   --   --   --   < > = values in this interval not displayed. ------------------------------------------------------------------------------------------------------------------  Cardiac Enzymes  Recent Labs Lab 12/22/14 2140  TROPONINI <0.03   ------------------------------------------------------------------------------------------------------------------  RADIOLOGY:  US Venous Img Lower Bilateral  12/28/2014   CLINICAL DATA:  Bilateral leg pain and edema. History of lung cancer. Evaluate for DVT.  EXAM: BILATERAL LOWER EXTREMITY VENOUS DOPPLER ULTRASOUND  TECHNIQUE: Gray-scale sonography with graded compression, as well as color Doppler and duplex ultrasound were performed to evaluate the lower extremity deep venous systems from the level of the common femoral vein and including the common femoral, femoral, profunda femoral, popliteal and calf veins including the posterior tibial, peroneal and gastrocnemius veins when visible. The superficial great saphenous vein was also interrogated. Spectral Doppler was utilized to evaluate flow at rest and with distal augmentation maneuvers in the common femoral, femoral and popliteal veins.  COMPARISON:  None.  FINDINGS: RIGHT LOWER EXTREMITY  Common Femoral Vein: No evidence of thrombus. Normal compressibility, respiratory phasicity and response to augmentation.  Saphenofemoral Junction: No evidence of thrombus. Normal compressibility and flow on color Doppler imaging.  Profunda Femoral Vein: No evidence of thrombus. Normal compressibility and flow on color Doppler imaging.  Femoral Vein: No evidence of thrombus. Normal compressibility, respiratory phasicity and response to augmentation.  Popliteal Vein: No evidence of thrombus. Normal compressibility, respiratory phasicity and response to augmentation.  Calf Veins: No evidence of thrombus.  Normal compressibility and flow on color Doppler imaging.  Superficial Great Saphenous Vein: No evidence of thrombus. Normal compressibility and flow on color Doppler imaging.  Venous Reflux:  None.  Other Findings:  None.  LEFT LOWER EXTREMITY  Common Femoral Vein: No evidence of thrombus. Normal compressibility, respiratory phasicity and response to augmentation.  Saphenofemoral Junction: No evidence of thrombus. Normal compressibility and flow on color Doppler imaging.  Profunda Femoral Vein: No evidence of thrombus. Normal compressibility and flow on color Doppler imaging.  Femoral Vein: No evidence of thrombus. Normal compressibility, respiratory phasicity and response to augmentation.  Popliteal Vein: No evidence of thrombus. Normal compressibility, respiratory phasicity and response to augmentation.  Calf Veins: No evidence of thrombus. Normal compressibility and flow on color Doppler imaging.  Superficial Great Saphenous Vein: No evidence of thrombus. Normal compressibility and flow on  color Doppler imaging.  Venous Reflux:  None.  Other Findings:  None.  IMPRESSION: No evidence of DVT within either lower extremity.   Electronically Signed   By: Sandi Mariscal M.D.   On: 12/28/2014 14:19     ASSESSMENT AND PLAN:   74 year old male with past medical history of GI bleed, stage IV melanoma, history of gout, history of CHF, hypertension who presented to the hospital due to shortness of breath and having difficulty walking.   #1 shortness of breath-most likely due to the right-sided pleural effusion which was seen on the CT scan of the chest. no evidence of pulmonary embolism.  -Possible underlying pneumonia, continue empiric ceftriaxone/Zithromax. Already finished 5 days of antibiotics. We will discontinue the antibiotics at this time. -Status post ultrasound-guided thoracentesis 9/29 with 2.5 L of fluid removed. Fluid analysis consistent with a transudative pleural effusion.  Cytology is negative for  malignancy. Found to have mesothelial cells.    #2 back/knee pain and difficulty walking-X-ray of the left knee did show some swelling but no evidence of acute bony abnormality. -acute gout: Of left knee; discontinue colchicine at this time secondary to severe diarrhea. Continue allopurinol, prednisone  Lower extremity swelling: Ultrasound of the legs negative for DVT. Disposition is skilled nursing facility secondary to  Ambulatory difficulties., #3 history of chronic diastolic CHF-clinically patient is not in congestive heart failure. -Continue Coreg, Lasix   Lower extremity ultrasound patient had echo done #4 history of chronic atrial fibrillation-currently rate controlled and continue Coreg.   - Discussed with Dr. Saralyn Pilar and due to pt's follow up issues from going back and forth from [place] he is not on long term anticoagulation and cont. ASA for now.  Discussed with him and o.k. To hold Propafenone for now.    #5 BPH-continue Flomax.  #6 anxiety-continue as needed Xanax.  #7 history of stage IV melanoma-patient being followed by oncology by Dr. Grayland Ormond.s/p immunotherapy yesterday.   #8 hypomagnesemia causing muscle cramps: Replaced  the magnesium.  All the records are reviewed and case discussed with Care Management/Social Workerr. Management plans discussed with the patient, family and they are in agreement.  CODE STATUS: Full  DVT Prophylaxis: Teds and SCDs  TOTAL TIME TAKING CARE OF THIS PATIENT: 25 minutes.   POSSIBLE D/C IN 2-3 DAYS, DEPENDING ON CLINICAL CONDITION.   Epifanio Lesches M.D on 12/29/2014 at 9:56 AM  Between 7am to 6pm - Pager - [phone]  After 6pm go to [email or web address] - password [email or web address] [facility]  Office  [phone]  CC: Primary care physician; Juluis Pitch, MD

## 2014-12-30 LAB — CBC
HEMATOCRIT: 38.5 % — AB (ref 40.0–52.0)
Hemoglobin: 12.2 g/dL — ABNORMAL LOW (ref 13.0–18.0)
MCH: 27.6 pg (ref 26.0–34.0)
MCHC: 31.6 g/dL — AB (ref 32.0–36.0)
MCV: 87.3 fL (ref 80.0–100.0)
PLATELETS: 199 10*3/uL (ref 150–440)
RBC: 4.41 MIL/uL (ref 4.40–5.90)
RDW: 18.4 % — AB (ref 11.5–14.5)
WBC: 9.9 10*3/uL (ref 3.8–10.6)

## 2014-12-30 LAB — BASIC METABOLIC PANEL
ANION GAP: 6 (ref 5–15)
BUN: 28 mg/dL — ABNORMAL HIGH (ref 6–20)
CALCIUM: 8 mg/dL — AB (ref 8.9–10.3)
CO2: 24 mmol/L (ref 22–32)
CREATININE: 1.13 mg/dL (ref 0.61–1.24)
Chloride: 112 mmol/L — ABNORMAL HIGH (ref 101–111)
GLUCOSE: 86 mg/dL (ref 65–99)
Potassium: 4.5 mmol/L (ref 3.5–5.1)
Sodium: 142 mmol/L (ref 135–145)

## 2014-12-30 MED ORDER — HYDROCODONE-ACETAMINOPHEN 5-325 MG PO TABS: 1.0000 | ORAL_TABLET | Freq: Four times a day (QID) | ORAL | Status: DC | PRN

## 2014-12-30 MED ORDER — PREDNISONE 5 MG PO TABS: ORAL_TABLET | ORAL | Status: DC

## 2014-12-30 MED ORDER — ALLOPURINOL 100 MG PO TABS: 100.0000 mg | ORAL_TABLET | Freq: Every day | ORAL | Status: AC

## 2014-12-30 MED ORDER — NYSTATIN 100000 UNIT/GM EX OINT: TOPICAL_OINTMENT | Freq: Three times a day (TID) | CUTANEOUS | Status: DC

## 2014-12-30 MED ORDER — LOPERAMIDE HCL 2 MG PO CAPS: 2.0000 mg | ORAL_CAPSULE | Freq: Four times a day (QID) | ORAL | Status: AC | PRN

## 2014-12-30 MED ORDER — ALPRAZOLAM 0.25 MG PO TABS: 0.2500 mg | ORAL_TABLET | Freq: Two times a day (BID) | ORAL | Status: AC | PRN

## 2014-12-30 MED ORDER — MAGNESIUM SULFATE 2 GM/50ML IV SOLN
2.0000 g | Freq: Once | INTRAVENOUS | Status: AC
  Administered 2014-12-30: 2 g via INTRAVENOUS
  Filled 2014-12-30: qty 50

## 2014-12-30 NOTE — Discharge Summary (Signed)
[facility] [place] NAME: Eddie Elliott    MR#:  [id number]  DATE OF BIRTH:  Jun 04, 1940  DATE OF ADMISSION:  12/22/2014 ADMITTING PHYSICIAN: Harrie Foreman, MD  DATE OF DISCHARGE: 12/30/2014  PRIMARY CARE PHYSICIAN: Juluis Pitch, MD    ADMISSION DIAGNOSIS:  Dyspnea [R06.00] RLL pneumonia [J18.9] Cystitis [N30.90]  DISCHARGE DIAGNOSIS:  Active Problems:   HCAP (healthcare-associated pneumonia)   SECONDARY DIAGNOSIS:   Past Medical History  Diagnosis Date  . 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 Hypertension   . Gout   . CHF (congestive heart failure)     HOSPITAL COURSE:   1. Right lung pleural effusion- patient had an ultrasound-guided thoracentesis done on 12/24/2014. Patient shortness of breath had improved. CT scan did not show pneumonia even though he was put on antibiotics initially. Patient now breathing comfortably on room air. 2. Acute gout of the left knee. Patient was seen in consultation by Dr. Jefm Bryant rheumatology and his left knee was tapped and injected with steroids. Patient was put on allopurinol and oral steroids. Patient still had persistent pain in his left knee and limited motion. I offered an MRI of the knee for further evaluation which the patient refused at this point. We will do conservative management with a steroids taper. Reevaluate after steroids taper for need for MRI of the knee. 3. Metastatic melanoma- patient received immunotherapy here in the hospital by Dr. Grayland Ormond. Follow-up with Dr. Grayland Ormond as outpatient. 4. Atrial fibrillation rate controlled- can restart Rythmol and continue Coreg. Aspirin for anticoagulation. 5. Nonsustained ventricular tachycardia- patient's magnesium was 1.7 and that was replaced IV. 6. Diarrhea- stool for C. difficile was negative. Can give when necessary Imodium.  DISCHARGE  CONDITIONS:   Satisfactory  CONSULTS OBTAINED:  Treatment Team:  Emmaline Kluver., MD  DRUG ALLERGIES:   Allergies  Allergen Reactions  . No Known Allergies     DISCHARGE MEDICATIONS:   Current Discharge Medication List    START taking these medications   Details  allopurinol (ZYLOPRIM) 100 MG tablet Take 1 tablet (100 mg total) by mouth daily. Qty: 30 tablet, Refills: 0    loperamide (IMODIUM) 2 MG capsule Take 1 capsule (2 mg total) by mouth every 6 (six) hours as needed for diarrhea or loose stools. Qty: 30 capsule, Refills: 0    nystatin ointment (MYCOSTATIN) Apply topically 3 (three) times daily. Qty: 30 g, Refills: 0    predniSONE (DELTASONE) 5 MG tablet Take 6 tabs day1,2;  Take 5 tabs day3,4; take 4 tabs day5,6; take 3 tabs day7,8; take 2 tabs day9,10; take 1 tab day11,12; take 1/2 table for eight days Qty: 46 tablet, Refills: 0      CONTINUE these medications which have CHANGED   Details  ALPRAZolam (XANAX) 0.25 MG tablet Take 1 tablet (0.25 mg total) by mouth 2 (two) times daily as needed for anxiety. Qty: 45 tablet, Refills: 0    HYDROcodone-acetaminophen (NORCO/VICODIN) 5-325 MG tablet Take 1 tablet by mouth every 6 (six) hours as needed for moderate pain. Qty: 60 tablet, Refills: 0      CONTINUE these medications which have NOT CHANGED   Details  aspirin EC 81 MG tablet Take by mouth.    carvedilol (COREG) 3.125 MG tablet Take 3.125 mg by mouth 2 (two) times daily with a meal.     furosemide (  LASIX) 20 MG tablet Take 1 tablet (20 mg total) by mouth daily. TAKE 1 TABLET (20 MG TOTAL) BY MOUTH ONCE a day to twice a day, PRN. Qty: 60 tablet, Refills: 1    propafenone (RYTHMOL) 300 MG tablet Take 300 mg by mouth 2 (two) times daily. Refills: 0    tamsulosin (FLOMAX) 0.4 MG CAPS capsule TAKE ONE CAPSULE BY MOUTH ONCE A DAY 30 MINUTES AFTER SAME MEAL EACH DAY      STOP taking these medications     prochlorperazine (COMPAZINE) 10 MG tablet           DISCHARGE INSTRUCTIONS:   Follow-up Dr. at rehabilitation one day Follow-up Dr. Grayland Ormond 2 weeks Follow-up Dr. Lovie Macadamia 2 weeks  If you experience worsening of your admission symptoms, develop shortness of breath, life threatening emergency, suicidal or homicidal thoughts you must seek medical attention immediately by calling 911 or calling your MD immediately  if symptoms less severe.  You Must read complete instructions/literature along with all the possible adverse reactions/side effects for all the Medicines you take and that have been prescribed to you. Take any new Medicines after you have completely understood and accept all the possible adverse reactions/side effects.   Please note  You were cared for by a hospitalist during your hospital stay. If you have any questions about your discharge medications or the care you received while you were in the hospital after you are discharged, you can call the unit and asked to speak with the hospitalist on call if the hospitalist that took care of you is not available. Once you are discharged, your primary care physician will handle any further medical issues. Please note that NO REFILLS for any discharge medications will be authorized once you are discharged, as it is imperative that you return to your primary care physician (or establish a relationship with a primary care physician if you do not have one) for your aftercare needs so that they can reassess your need for medications and monitor your lab values.    Today   CHIEF COMPLAINT:   Chief Complaint  Patient presents with  . Shortness of Breath    HISTORY OF PRESENT ILLNESS:  Eddie Elliott  is a 74 y.o. male with a known history of metastatic melanoma. He presented to the ER with shortness of breath and found to have a right pleural effusion   VITAL SIGNS:  Blood pressure 105/85, pulse 105, temperature 98.1 F (36.7 C), temperature source Oral, resp. rate 18, height  5\' 7"  (1.702 m), weight 113.218 kg (249 lb 9.6 oz), SpO2 97 %.    PHYSICAL EXAMINATION:  GENERAL:  74 y.o.-year-old patient lying in the bed with no acute distress.  EYES: Pupils equal, round, reactive to light and accommodation. No scleral icterus. Extraocular muscles intact.  HEENT: Head atraumatic, normocephalic. Oropharynx and nasopharynx clear.  NECK:  Supple, no jugular venous distention. No thyroid enlargement, no tenderness.  LUNGS: Normal breath sounds bilaterally, no wheezing, rales,rhonchi or crepitation. No use of accessory muscles of respiration.  CARDIOVASCULAR: S1, S2 normal. No murmurs, rubs, or gallops.  ABDOMEN: Soft, non-tender, non-distended. Bowel sounds present. No organomegaly or mass.  EXTREMITIES: 2+ edema, no cyanosis, or clubbing. Limited range of motion left knee with pain NEUROLOGIC: Cranial nerves II through XII are intact. Gait not checked.  PSYCHIATRIC: The patient is alert and oriented x 3.  SKIN: No obvious rash, lesion, or ulcer.   DATA REVIEW:   CBC  Recent Labs Lab  12/30/14 0427  WBC 9.9  HGB 12.2*  HCT 38.5*  PLT 199    Chemistries   Recent Labs Lab 12/29/14 0536 12/30/14 0427  NA  --  142  K  --  4.5  CL  --  112*  CO2  --  24  GLUCOSE  --  86  BUN  --  28*  CREATININE  --  1.13  CALCIUM  --  8.0*  MG 1.7  --      Microbiology Results  Results for orders placed or performed during the hospital encounter of 12/22/14  Blood Culture (routine x 2)     Status: None   Collection Time: 12/22/14  9:40 PM  Result Value Ref Range Status   Specimen Description BLOOD LEFT HAND  Final   Special Requests BOTTLES DRAWN AEROBIC AND ANAEROBIC 5ML  Final   Culture NO GROWTH 5 DAYS  Final   Report Status 12/27/2014 FINAL  Final  Urine culture     Status: None   Collection Time: 12/22/14  9:40 PM  Result Value Ref Range Status   Specimen Description URINE, RANDOM  Final   Special Requests NONE  Final   Culture MULTIPLE SPECIES PRESENT,  SUGGEST RECOLLECTION  Final   Report Status 12/24/2014 FINAL  Final  Blood Culture (routine x 2)     Status: None   Collection Time: 12/22/14 10:06 PM  Result Value Ref Range Status   Specimen Description BLOOD BLOOD LEFT FOREARM  Final   Special Requests   Final    BOTTLES DRAWN AEROBIC AND ANAEROBIC 5MLAEROIC, 4MLANAEROBIC   Culture NO GROWTH 5 DAYS  Final   Report Status 12/27/2014 FINAL  Final  Body fluid culture     Status: None   Collection Time: 12/24/14  9:50 AM  Result Value Ref Range Status   Specimen Description PLEURAL  Final   Special Requests PLEURAL FLUID  Final   Gram Stain FEW WBC SEEN NO ORGANISMS SEEN   Final   Culture NO ANAEROBES ISOLATED NO GROWTH   Final   Report Status 12/28/2014 FINAL  Final  C difficile quick scan w PCR reflex     Status: None   Collection Time: 12/28/14 10:38 AM  Result Value Ref Range Status   C Diff antigen NEGATIVE NEGATIVE Final   C Diff toxin NEGATIVE NEGATIVE Final   C Diff interpretation Negative for C. difficile  Final   Management plans discussed with the patient, family and they are in agreement.  CODE STATUS:     Code Status Orders        Start     Ordered   12/23/14 0342  Full code   Continuous     12/23/14 0341    Advance Directive Documentation        Most Recent Value   Type of Advance Directive  Healthcare Power of Attorney, Living will   Pre-existing out of facility DNR order (yellow form or pink MOST form)     "MOST" Form in Place?        TOTAL TIME TAKING CARE OF THIS PATIENT: 50 minutes.    Loletha Grayer M.D on 12/30/2014 at 2:52 PM  Between 7am to 6pm - Pager - [phone]  After 6pm go to [email or web address] - password [email or web address] [facility]  Office  [phone]  CC: Primary care physician; Juluis Pitch, MD

## 2014-12-30 NOTE — Clinical Social Work Placement (Signed)
   CLINICAL SOCIAL WORK PLACEMENT  NOTE  Date:  12/30/2014  Patient Details  Name: Eddie Elliott MRN: [id number] Date of Birth: May 29, 1940  Clinical Social Work is seeking post-discharge placement for this patient at the [place] level of care (*CSW will initial, date and re-position this form in  chart as items are completed):  Yes   Patient/family provided with [facility] Department's list of facilities offering this level of care within the geographic area requested by the patient (or if unable, by the patient's family).  Yes   Patient/family informed of their freedom to choose among providers that offer the needed level of care, that participate in Medicare, Medicaid or managed care program needed by the patient, have an available bed and are willing to accept the patient.  Yes   Patient/family informed of [facility]'s ownership interest in [facility] and [facility], as well as of the fact that they are under no obligation to receive care at these facilities.  PASRR submitted to EDS on 12/28/14     PASRR number received on 12/28/14     Existing PASRR number confirmed on       FL2 transmitted to all facilities in geographic area requested by pt/family on 12/28/14     FL2 transmitted to all facilities within larger geographic area on       Patient informed that his/her managed care company has contracts with or will negotiate with certain facilities, including the following:        Yes   Patient/family informed of bed offers received.  Patient chooses bed at  [facility])     Physician recommends and patient chooses bed at      Patient to be transferred to [facility] on 12/30/14.  Patient to be transferred to facility by ems     Patient family notified on 12/30/14 of transfer.  Name of family member notified:  wife at bedside     PHYSICIAN Please sign FL2     Additional Comment:     _______________________________________________ Alonna Buckler, LCSW 12/30/2014, 5:05 PM

## 2014-12-30 NOTE — Progress Notes (Signed)
EMS present for pt discharge; discharge packet given to EMS personnel to take to [facility]; pt discharged via stretcher by EMS personnel to [facility]

## 2014-12-30 NOTE — Progress Notes (Signed)
PT Cancellation Note  Patient Details Name: Eddie Elliott MRN: [id number] DOB: May 17, 1940   Cancelled Treatment:    Reason Eval/Treat Not Completed: Medical issues which prohibited therapy. Chart reviewed. RN/MD consulted. Pt had run of vtach last night and is currently receiving IV Mg. MD reports pt is not feeling well and requests defer PT at this time. Will attempt on later date as patient is appropriate.  Lyndel Safe Jeannene Tschetter PT, DPT   Geovanie Winnett 12/30/2014, 10:56 AM

## 2014-12-30 NOTE — Progress Notes (Signed)
Paged and spoke with Dr. Reece Levy about pt 15-20 beat run of Vtach. Per Dr. Reece Levy keep on monitoring as pt is asymptomatic.

## 2014-12-30 NOTE — Progress Notes (Addendum)
Patient ID: Shivaay Stormont, male   DOB: 1940-08-19, 74 y.o.   MRN: [id number] [facility] PROGRESS NOTE  PCP: Juluis Pitch, MD  HPI/Subjective: Patient complaining about left knee pain. He can hardly put any weight on it. Pain is severe in nature. Patient also having 6-7 episodes of diarrhea nightly. His breathing is okay. He is concerned about going to rehabilitation and once his wife whose gets at a class this afternoon to be involved in decision-making process.  Objective: Filed Vitals:   12/30/14 0500  BP: 108/72  Pulse: 90  Temp: 98.3 F (36.8 C)  Resp: 18    Filed Weights   12/28/14 0500 12/29/14 0500 12/30/14 0500  Weight: 114.488 kg (252 lb 6.4 oz) 114.76 kg (253 lb) 113.218 kg (249 lb 9.6 oz)    ROS: Review of Systems  Constitutional: Negative for fever and chills.  Eyes: Negative for blurred vision.  Respiratory: Negative for cough and shortness of breath.   Cardiovascular: Negative for chest pain.  Gastrointestinal: Positive for diarrhea. Negative for nausea, vomiting, abdominal pain and constipation.  Genitourinary: Negative for dysuria.  Musculoskeletal: Positive for joint pain.  Neurological: Negative for dizziness and headaches.   Exam: Physical Exam  Constitutional: He is oriented to person, place, and time.  HENT:  Nose: No mucosal edema.  Mouth/Throat: No oropharyngeal exudate or posterior oropharyngeal edema.  Eyes: Conjunctivae, EOM and lids are normal. Pupils are equal, round, and reactive to light.  Neck: No JVD present. Carotid bruit is not present. No edema present. No thyroid mass and no thyromegaly present.  Cardiovascular: S1 normal and S2 normal.  Exam reveals no gallop.   No murmur heard. Pulses:      Dorsalis pedis pulses are 2+ on the right side, and 2+ on the left side.  Respiratory: No respiratory distress. He has no wheezes. He has no rhonchi. He has no rales.  GI: Soft. Bowel sounds are normal. There is no  tenderness.  Musculoskeletal:       Left knee: He exhibits decreased range of motion and swelling.       Right ankle: He exhibits no swelling.       Left ankle: He exhibits no swelling.  Limited painful range of motion left knee  Lymphadenopathy:    He has no cervical adenopathy.  Neurological: He is alert and oriented to person, place, and time. No cranial nerve deficit.  Skin: Skin is warm. No rash noted. Nails show no clubbing.  Psychiatric: He has a normal mood and affect.    Data Reviewed: Basic Metabolic Panel:  Recent Labs Lab 12/24/14 0541 12/25/14 0431 12/26/14 0557 12/27/14 1325 12/28/14 0419 12/29/14 0536 12/30/14 0427  NA 140 141 142  --   --   --  142  K 3.8 4.4 4.3 4.2  --   --  4.5  CL 103 104 107  --   --   --  112*  CO2 30 29 30   --   --   --  24  GLUCOSE 98 154* 133*  --   --   --  86  BUN 20 17 25*  --   --   --  28*  CREATININE 1.33* 1.11 1.09  --  1.07  --  1.13  CALCIUM 7.5* 7.8* 7.7*  --   --   --  8.0*  MG  --   --   --  1.2*  --  1.7  --    CBC:  Recent Labs Lab 12/25/14 0431 12/26/14 0557 12/30/14 0427  WBC 7.8 11.5* 9.9  HGB 12.1* 11.8* 12.2*  HCT 37.7* 37.0* 38.5*  MCV 87.2 85.8 87.3  PLT 158 187 199    CBG:  Recent Labs Lab 12/28/14 2151  GLUCAP 100*    Recent Results (from the past 240 hour(s))  Blood Culture (routine x 2)     Status: None   Collection Time: 12/22/14  9:40 PM  Result Value Ref Range Status   Specimen Description BLOOD LEFT HAND  Final   Special Requests BOTTLES DRAWN AEROBIC AND ANAEROBIC 5ML  Final   Culture NO GROWTH 5 DAYS  Final   Report Status 12/27/2014 FINAL  Final  Urine culture     Status: None   Collection Time: 12/22/14  9:40 PM  Result Value Ref Range Status   Specimen Description URINE, RANDOM  Final   Special Requests NONE  Final   Culture MULTIPLE SPECIES PRESENT, SUGGEST RECOLLECTION  Final   Report Status 12/24/2014 FINAL  Final  Blood Culture (routine x 2)     Status: None    Collection Time: 12/22/14 10:06 PM  Result Value Ref Range Status   Specimen Description BLOOD BLOOD LEFT FOREARM  Final   Special Requests   Final    BOTTLES DRAWN AEROBIC AND ANAEROBIC 5MLAEROIC, 4MLANAEROBIC   Culture NO GROWTH 5 DAYS  Final   Report Status 12/27/2014 FINAL  Final  Body fluid culture     Status: None   Collection Time: 12/24/14  9:50 AM  Result Value Ref Range Status   Specimen Description PLEURAL  Final   Special Requests PLEURAL FLUID  Final   Gram Stain FEW WBC SEEN NO ORGANISMS SEEN   Final   Culture NO ANAEROBES ISOLATED NO GROWTH   Final   Report Status 12/28/2014 FINAL  Final  C difficile quick scan w PCR reflex     Status: None   Collection Time: 12/28/14 10:38 AM  Result Value Ref Range Status   C Diff antigen NEGATIVE NEGATIVE Final   C Diff toxin NEGATIVE NEGATIVE Final   C Diff interpretation Negative for C. difficile  Final     Studies: US Venous Img Lower Bilateral  12/28/2014   CLINICAL DATA:  Bilateral leg pain and edema. History of lung cancer. Evaluate for DVT.  EXAM: BILATERAL LOWER EXTREMITY VENOUS DOPPLER ULTRASOUND  TECHNIQUE: Gray-scale sonography with graded compression, as well as color Doppler and duplex ultrasound were performed to evaluate the lower extremity deep venous systems from the level of the common femoral vein and including the common femoral, femoral, profunda femoral, popliteal and calf veins including the posterior tibial, peroneal and gastrocnemius veins when visible. The superficial great saphenous vein was also interrogated. Spectral Doppler was utilized to evaluate flow at rest and with distal augmentation maneuvers in the common femoral, femoral and popliteal veins.  COMPARISON:  None.  FINDINGS: RIGHT LOWER EXTREMITY  Common Femoral Vein: No evidence of thrombus. Normal compressibility, respiratory phasicity and response to augmentation.  Saphenofemoral Junction: No evidence of thrombus. Normal compressibility and flow on  color Doppler imaging.  Profunda Femoral Vein: No evidence of thrombus. Normal compressibility and flow on color Doppler imaging.  Femoral Vein: No evidence of thrombus. Normal compressibility, respiratory phasicity and response to augmentation.  Popliteal Vein: No evidence of thrombus. Normal compressibility, respiratory phasicity and response to augmentation.  Calf Veins: No evidence of thrombus. Normal compressibility and flow on color Doppler imaging.  Superficial Great Saphenous  Vein: No evidence of thrombus. Normal compressibility and flow on color Doppler imaging.  Venous Reflux:  None.  Other Findings:  None.  LEFT LOWER EXTREMITY  Common Femoral Vein: No evidence of thrombus. Normal compressibility, respiratory phasicity and response to augmentation.  Saphenofemoral Junction: No evidence of thrombus. Normal compressibility and flow on color Doppler imaging.  Profunda Femoral Vein: No evidence of thrombus. Normal compressibility and flow on color Doppler imaging.  Femoral Vein: No evidence of thrombus. Normal compressibility, respiratory phasicity and response to augmentation.  Popliteal Vein: No evidence of thrombus. Normal compressibility, respiratory phasicity and response to augmentation.  Calf Veins: No evidence of thrombus. Normal compressibility and flow on color Doppler imaging.  Superficial Great Saphenous Vein: No evidence of thrombus. Normal compressibility and flow on color Doppler imaging.  Venous Reflux:  None.  Other Findings:  None.  IMPRESSION: No evidence of DVT within either lower extremity.   Electronically Signed   By: Sandi Mariscal M.D.   On: 12/28/2014 14:19    Scheduled Meds: . allopurinol  100 mg Oral Daily  . carvedilol  3.125 mg Oral BID WC  . enoxaparin (LOVENOX) injection  40 mg Subcutaneous Q24H  . furosemide  20 mg Oral Daily  . magnesium sulfate 1 - 4 g bolus IVPB  2 g Intravenous Once  . metaxalone  800 mg Oral TID  . nystatin ointment   Topical TID  . pantoprazole   40 mg Oral QAC breakfast  . predniSONE  50 mg Oral Q breakfast  . sodium chloride  3 mL Intravenous Q12H  . tamsulosin  0.4 mg Oral Daily    Assessment/Plan:  1. Acute left knee pain- likely gout. Patient on 50 mg of prednisone. I spoke with rheumatologist Dr. Precious Reel. He already injected the knee back on the 29th and would not do another injection. Since the patient is still having pain and he recommended an MRI of the need to make sure there is no bony involvement or avascular necrosis. The patient absolutely refused an MRI at this time. 2. Right-sided pleural effusion. Repeat chest x-ray after thoracentesis negative for reaccumulation of fluid. 3. Stage IV melanoma- with immunotherapy with Dr. Grayland Ormond 4. Gastroesophageal reflux disease without esophagitis on Protonix 5. BPH on Flomax 6. Diarrhea- when necessary Imodium 7. Nonsustained V. tach with hypomagnesemia- 2 g of IV magnesium going now.  Patient would like his wife in on the decision to go to rehabilitation and she gets out of class today 2 o'clock.  Code Status:     Code Status Orders        Start     Ordered   12/23/14 0342  Full code   Continuous     12/23/14 0341    Advance Directive Documentation        Most Recent Value   Type of Advance Directive  Healthcare Power of Attorney, Living will   Pre-existing out of facility DNR order (yellow form or pink MOST form)     "MOST" Form in Place?       Disposition Plan: to rehabilitation soon  Time spent: 30 minutes  Loletha Grayer  [facility]

## 2014-12-30 NOTE — Progress Notes (Signed)
CSW was notified that Pt has been medically cleared for dc to SNF [facility]. Pt is in agreement. CSW arranged for SNF liaison to meet with Pt to answer any questions specific to their facility. Pt appreciative. Pt's wife is at bedside. RN to call report and EMS for transport. CSW faxed dc summary and prepared packet for dc.    No further CSW needs at this time.   Toma Copier, [place]

## 2014-12-30 NOTE — Care Management Important Message (Signed)
Important Message  Patient Details  Name: Eddie Elliott MRN: [id number] Date of Birth: [birth year]   Medicare Important Message Given:  Yes-fourth notification given    Shelbie Ammons, RN 12/30/2014, 1:30 PM

## 2014-12-30 NOTE — Plan of Care (Signed)
Problem: Discharge Progression Outcomes Goal: Other Discharge Outcomes/Goals Outcome: Progressing Pt is alert and is still having loose stool. Pt refuse imodium. Pt had a unsustainable 16-20 beat run of  Vtach. Pt was asymptomatic. No other signs of distress noted. Will continue to monitor.

## 2014-12-30 NOTE — Progress Notes (Signed)
MD order received to discharge pt today to SNF/[facility]; Care Management Baxter Flattery MSW) previously prepared discharge packet for EMS personnel to take to the facility; discharge report called to [facility], spoke with Era RN, no further questions voiced at this time; pt's discharge pending arrival of EMS for nonemergency transport

## 2015-01-11 ENCOUNTER — Inpatient Hospital Stay: Payer: Medicare Other

## 2015-01-11 ENCOUNTER — Inpatient Hospital Stay: Payer: Medicare Other | Attending: Oncology | Admitting: Oncology

## 2015-01-11 VITALS — BP 96/58 | HR 106 | Temp 98.2°F | Resp 20

## 2015-01-11 DIAGNOSIS — M109 Gout, unspecified: Secondary | ICD-10-CM | POA: Diagnosis not present

## 2015-01-11 DIAGNOSIS — M7989 Other specified soft tissue disorders: Secondary | ICD-10-CM

## 2015-01-11 DIAGNOSIS — I10 Essential (primary) hypertension: Secondary | ICD-10-CM | POA: Diagnosis not present

## 2015-01-11 DIAGNOSIS — C7800 Secondary malignant neoplasm of unspecified lung: Secondary | ICD-10-CM

## 2015-01-11 DIAGNOSIS — C439 Malignant melanoma of skin, unspecified: Secondary | ICD-10-CM

## 2015-01-11 DIAGNOSIS — R531 Weakness: Secondary | ICD-10-CM | POA: Diagnosis not present

## 2015-01-11 DIAGNOSIS — Z79899 Other long term (current) drug therapy: Secondary | ICD-10-CM | POA: Diagnosis not present

## 2015-01-11 DIAGNOSIS — Z7982 Long term (current) use of aspirin: Secondary | ICD-10-CM | POA: Diagnosis not present

## 2015-01-11 DIAGNOSIS — Z7952 Long term (current) use of systemic steroids: Secondary | ICD-10-CM

## 2015-01-11 DIAGNOSIS — N19 Unspecified kidney failure: Secondary | ICD-10-CM | POA: Diagnosis not present

## 2015-01-11 DIAGNOSIS — R5383 Other fatigue: Secondary | ICD-10-CM

## 2015-01-11 DIAGNOSIS — M2550 Pain in unspecified joint: Secondary | ICD-10-CM

## 2015-01-11 DIAGNOSIS — R197 Diarrhea, unspecified: Secondary | ICD-10-CM | POA: Diagnosis not present

## 2015-01-11 DIAGNOSIS — R410 Disorientation, unspecified: Secondary | ICD-10-CM

## 2015-01-11 DIAGNOSIS — E860 Dehydration: Secondary | ICD-10-CM | POA: Diagnosis not present

## 2015-01-11 DIAGNOSIS — R630 Anorexia: Secondary | ICD-10-CM | POA: Diagnosis not present

## 2015-01-11 DIAGNOSIS — I509 Heart failure, unspecified: Secondary | ICD-10-CM | POA: Diagnosis not present

## 2015-01-11 DIAGNOSIS — J9811 Atelectasis: Secondary | ICD-10-CM

## 2015-01-11 DIAGNOSIS — C7951 Secondary malignant neoplasm of bone: Secondary | ICD-10-CM | POA: Diagnosis not present

## 2015-01-11 DIAGNOSIS — J90 Pleural effusion, not elsewhere classified: Secondary | ICD-10-CM | POA: Diagnosis not present

## 2015-01-11 LAB — CBC WITH DIFFERENTIAL/PLATELET
BASOS ABS: 0 10*3/uL (ref 0–0.1)
Eosinophils Absolute: 0.2 10*3/uL (ref 0–0.7)
Eosinophils Relative: 1 %
HEMATOCRIT: 44.1 % (ref 40.0–52.0)
HEMOGLOBIN: 13.6 g/dL (ref 13.0–18.0)
Lymphs Abs: 1.1 10*3/uL (ref 1.0–3.6)
MCH: 27.2 pg (ref 26.0–34.0)
MCHC: 30.9 g/dL — ABNORMAL LOW (ref 32.0–36.0)
MCV: 88 fL (ref 80.0–100.0)
Monocytes Absolute: 1 10*3/uL (ref 0.2–1.0)
Monocytes Relative: 9 %
NEUTROS ABS: 9.6 10*3/uL — AB (ref 1.4–6.5)
Neutrophils Relative %: 81 %
Platelets: 161 10*3/uL (ref 150–440)
RBC: 5.01 MIL/uL (ref 4.40–5.90)
RDW: 20.3 % — ABNORMAL HIGH (ref 11.5–14.5)
WBC: 11.8 10*3/uL — ABNORMAL HIGH (ref 3.8–10.6)

## 2015-01-11 LAB — COMPREHENSIVE METABOLIC PANEL
ALBUMIN: 3.3 g/dL — AB (ref 3.5–5.0)
ALT: 12 U/L — ABNORMAL LOW (ref 17–63)
AST: 14 U/L — AB (ref 15–41)
Alkaline Phosphatase: 57 U/L (ref 38–126)
Anion gap: 3 — ABNORMAL LOW (ref 5–15)
BILIRUBIN TOTAL: 1 mg/dL (ref 0.3–1.2)
BUN: 51 mg/dL — AB (ref 6–20)
CALCIUM: 8.1 mg/dL — AB (ref 8.9–10.3)
CO2: 25 mmol/L (ref 22–32)
CREATININE: 2.3 mg/dL — AB (ref 0.61–1.24)
Chloride: 110 mmol/L (ref 101–111)
GFR calc Af Amer: 30 mL/min — ABNORMAL LOW (ref >60)
GFR, EST NON AFRICAN AMERICAN: 26 mL/min — AB (ref >60)
GLUCOSE: 124 mg/dL — AB (ref 65–99)
POTASSIUM: 4.7 mmol/L (ref 3.5–5.1)
Sodium: 138 mmol/L (ref 135–145)
TOTAL PROTEIN: 5.8 g/dL — AB (ref 6.5–8.1)

## 2015-01-11 MED ORDER — SODIUM CHLORIDE 0.9 % IV SOLN
INTRAVENOUS | Status: DC
  Administered 2015-01-11: 12:00:00 via INTRAVENOUS
  Filled 2015-01-11: qty 1000

## 2015-01-11 MED ORDER — PREDNISONE 5 MG (21) PO TBPK: 5.0000 mg | ORAL_TABLET | Freq: Every day | ORAL | Status: AC

## 2015-01-12 ENCOUNTER — Telehealth: Payer: Self-pay | Admitting: *Deleted

## 2015-01-12 NOTE — Telephone Encounter (Signed)
Called to report that Eddie Elliott passed away last night

## 2015-01-19 ENCOUNTER — Ambulatory Visit: Payer: Medicare Other

## 2015-01-19 ENCOUNTER — Other Ambulatory Visit: Payer: Medicare Other

## 2015-01-19 ENCOUNTER — Ambulatory Visit: Payer: Medicare Other | Admitting: Oncology

## 2015-01-23 NOTE — Progress Notes (Signed)
[facility]  [phone] [phone]  ID: Eddie Elliott OB: [birth year]  MR#: [id number]  EXB#:[id number]  Patient Care Team: Juluis Pitch, MD as PCP - General (Family Medicine)  CHIEF COMPLAINT:  Chief Complaint  Patient presents with  . Melanoma     INTERVAL HISTORY: Patient returns to clinic today for further evaluation, hospital follow-up, and consideration of additional nivolumab. His performance status has significantly declined. He has a poor appetite. His wife reports intermittent confusion. He continues to have pain.  He denies any further GI bleeding. He denies any chest pain, shortness of breath, cough, or hemoptysis. He denies any fevers or weight loss. He denies any nausea, vomiting, or constipation. He has no urinary complaints. Patient offers no further specific complaints today.   REVIEW OF SYSTEMS:   Review of Systems  Constitutional: Positive for malaise/fatigue.  Respiratory: Negative.   Cardiovascular: Positive for leg swelling.  Gastrointestinal: Negative for diarrhea.  Genitourinary: Negative.   Musculoskeletal: Positive for joint pain.  Neurological: Positive for weakness.  Psychiatric/Behavioral: Positive for memory loss.    As per HPI. Otherwise, a complete review of systems is negatve.  PAST MEDICAL HISTORY: Past Medical History  Diagnosis Date  . 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 Hypertension   . Gout   . CHF (congestive heart failure)     PAST SURGICAL HISTORY: Past Surgical History  Procedure Laterality Date  . Appendectomy    . Partial hip arthroplasty Right     FAMILY HISTORY: Reviewed and unchanged. No reported history of malignancy or chronic disease.     ADVANCED DIRECTIVES:    HEALTH MAINTENANCE: Social History  Substance Use Topics  . Smoking status: Current Every Day Smoker -- 1.00 packs/day   Types: Cigarettes  . Smokeless tobacco: Never Used  . Alcohol Use: No     Colonoscopy:  PAP:  Bone density:  Lipid panel:  Allergies  Allergen Reactions  . No Known Allergies     Current Outpatient Prescriptions  Medication Sig Dispense Refill  . allopurinol (ZYLOPRIM) 100 MG tablet Take 1 tablet (100 mg total) by mouth daily. 30 tablet 0  . ALPRAZolam (XANAX) 0.25 MG tablet Take 1 tablet (0.25 mg total) by mouth 2 (two) times daily as needed for anxiety. 45 tablet 0  . aspirin EC 81 MG tablet Take by mouth.    . carvedilol (COREG) 3.125 MG tablet Take 3.125 mg by mouth 2 (two) times daily with a meal.     . furosemide (LASIX) 20 MG tablet Take 1 tablet (20 mg total) by mouth daily. TAKE 1 TABLET (20 MG TOTAL) BY MOUTH ONCE a day to twice a day, PRN. 60 tablet 1  . loperamide (IMODIUM) 2 MG capsule Take 1 capsule (2 mg total) by mouth every 6 (six) hours as needed for diarrhea or loose stools. 30 capsule 0  . pantoprazole (PROTONIX) 40 MG tablet Take 40 mg by mouth daily.    . predniSONE (STERAPRED UNI-PAK 21 TAB) 5 MG (21) TBPK tablet Take 1 tablet (5 mg total) by mouth daily. 21 tablet 0  . propafenone (RYTHMOL) 300 MG tablet Take 300 mg by mouth 2 (two) times daily.  0  . tamsulosin (FLOMAX) 0.4 MG CAPS capsule TAKE ONE CAPSULE BY MOUTH ONCE A DAY 30 MINUTES AFTER SAME MEAL EACH DAY     Current Facility-Administered Medications  Medication Dose Route  Frequency Provider Last Rate Last Dose  . 0.9 %  sodium chloride infusion   Intravenous Continuous Lloyd Huger, MD   Stopped at 01/28/2015 1405    OBJECTIVE: Filed Vitals:   01/28/2015 1302  BP: 96/58  Pulse: 106  Temp: 98.2 F (36.8 C)  Resp: 20     There is no weight on file to calculate BMI.    ECOG FS:3 - Symptomatic, >50% confined to bed  General: Ill-appearing, no acute distress. Eyes: anicteric sclera. Lungs: Clear to auscultation bilaterally. Heart: Regular rate and rhythm. No rubs, murmurs, or  gallops. Abdomen: Soft, nontender, nondistended. No organomegaly noted, normoactive bowel sounds. Musculoskeletal: 1+ bilateral lower extremity edema. Neuro: Lethargic, but answering all questions appropriately. Cranial nerves grossly intact. Skin: No rashes or petechiae noted. Psych: Normal affect.   LAB RESULTS:  Lab Results  Component Value Date   NA 138 Jan 28, 2015   K 4.7 01-28-2015   CL 110 2015-01-28   CO2 25 2015-01-28   GLUCOSE 124* 01/28/2015   BUN 51* 2015/01/28   CREATININE 2.30* 01/28/15   CALCIUM 8.1* 01-28-2015   PROT 5.8* 01/28/2015   ALBUMIN 3.3* Jan 28, 2015   AST 14* 2015-01-28   ALT 12* Jan 28, 2015   ALKPHOS 57 01/28/2015   BILITOT 1.0 Jan 28, 2015   GFRNONAA 26* Jan 28, 2015   GFRAA 30* Jan 28, 2015    Lab Results  Component Value Date   WBC 11.8* Jan 28, 2015   NEUTROABS 9.6* 28-Jan-2015   HGB 13.6 01/28/2015   HCT 44.1 2015/01/28   MCV 88.0 01/28/2015   PLT 161 01-28-15     STUDIES: Dg Chest 1 View  12/26/2014  CLINICAL DATA:  Pleural effusion EXAM: CHEST 1 VIEW COMPARISON:  12/24/2014 FINDINGS: Stable enlarged cardiac silhouette. There is improvement in RIGHT basilar atelectasis. Small bilateral effusions. IMPRESSION: Improved RIGHT basilar atelectasis. Small effusions and cardiomegaly. Electronically Signed   By: Suzy Bouchard M.D.   On: 12/26/2014 07:35   US Venous Img Lower Bilateral  12/28/2014  CLINICAL DATA:  Bilateral leg pain and edema. History of lung cancer. Evaluate for DVT. EXAM: BILATERAL LOWER EXTREMITY VENOUS DOPPLER ULTRASOUND TECHNIQUE: Gray-scale sonography with graded compression, as well as color Doppler and duplex ultrasound were performed to evaluate the lower extremity deep venous systems from the level of the common femoral vein and including the common femoral, femoral, profunda femoral, popliteal and calf veins including the posterior tibial, peroneal and gastrocnemius veins when visible. The superficial great saphenous vein  was also interrogated. Spectral Doppler was utilized to evaluate flow at rest and with distal augmentation maneuvers in the common femoral, femoral and popliteal veins. COMPARISON:  None. FINDINGS: RIGHT LOWER EXTREMITY Common Femoral Vein: No evidence of thrombus. Normal compressibility, respiratory phasicity and response to augmentation. Saphenofemoral Junction: No evidence of thrombus. Normal compressibility and flow on color Doppler imaging. Profunda Femoral Vein: No evidence of thrombus. Normal compressibility and flow on color Doppler imaging. Femoral Vein: No evidence of thrombus. Normal compressibility, respiratory phasicity and response to augmentation. Popliteal Vein: No evidence of thrombus. Normal compressibility, respiratory phasicity and response to augmentation. Calf Veins: No evidence of thrombus. Normal compressibility and flow on color Doppler imaging. Superficial Great Saphenous Vein: No evidence of thrombus. Normal compressibility and flow on color Doppler imaging. Venous Reflux:  None. Other Findings:  None. LEFT LOWER EXTREMITY Common Femoral Vein: No evidence of thrombus. Normal compressibility, respiratory phasicity and response to augmentation. Saphenofemoral Junction: No evidence of thrombus. Normal compressibility and flow on color Doppler imaging. Profunda Femoral Vein: No  evidence of thrombus. Normal compressibility and flow on color Doppler imaging. Femoral Vein: No evidence of thrombus. Normal compressibility, respiratory phasicity and response to augmentation. Popliteal Vein: No evidence of thrombus. Normal compressibility, respiratory phasicity and response to augmentation. Calf Veins: No evidence of thrombus. Normal compressibility and flow on color Doppler imaging. Superficial Great Saphenous Vein: No evidence of thrombus. Normal compressibility and flow on color Doppler imaging. Venous Reflux:  None. Other Findings:  None. IMPRESSION: No evidence of DVT within either lower  extremity. Electronically Signed   By: Sandi Mariscal M.D.   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  PLAN:    1. Melanoma: PET results from November 13, 2014 were reviewed independently with essentially stable disease. Patient also has now completed XRT to the lesion at his L5 vertebrae. Will hold Nivolumab given his renal failure and declining performance status. Patient will instead receive IV fluids and return to clinic in 1 week for reconsideration of treatment. 2. GI bleed: Patient reports no further bleeding. He will likely need a local gastroenterologist for continued follow-up. No biopsies were taken during his EGD in [place] to determine whether this was unusual site of metastatic disease. 3. Anemia: Patient's hemoglobin is now within normal limits. 4. Peripheral edema: We will hold Lasix given patient's renal failure. 5. Diarrhea: Steroids have been tapered off and consider Remicade in the future if it recurs.  6. Renal failure: Likely multifactorial including declining performance status and dehydration. IV fluids as above. 7. Declining performance status: Hold treatment. Consider reimaging for progression of disease. 8. Confusion, lethargy: Patient is taking increased doses of Xanax which may be contributing.  Approximately 30 minutes was spent in discussion and consultation.  Patient expressed understanding and was in agreement with this plan. He also understands that He can call clinic at any time with any questions, concerns, or complaints.   Melanoma of skin   Staging form: Melanoma of the Skin, AJCC 7th Edition     Clinical stage from 08/30/2014: Stage IV [place], NX, M1c) - Signed by Lloyd Huger, MD on 08/30/2014   Lloyd Huger, MD   01/23/2015 8:01 PM

## 2015-01-26 NOTE — Progress Notes (Signed)
Patient was discharged from hospital to [facility].  He comes in today very weak and sleeping a lot and his wife says the residence has been giving him Xanax which he is not used to taking.  His wife says he is not eating much.

## 2015-01-26 DEATH — deceased

## 2015-01-29 LAB — PH, BODY FLUID: PH, BODY FLUID: 7.8

## 2015-03-24 ENCOUNTER — Other Ambulatory Visit: Payer: Self-pay | Admitting: Nurse Practitioner

## 2015-11-03 IMAGING — CT NM PET IMAGE RESTAGE (PS) WHOLE BODY
10 series · 25 of 25 positions shown · non-contrast
Comparison: None.

CLINICAL DATA: Subsequent treatment strategy for melanoma

EXAM:
NUCLEAR MEDICINE PET WHOLE BODY
TECHNIQUE: 12.51 mCi F-18 FDG was injected intravenously. Full-ring PET imaging
was performed from the vertex to the feet after the radiotracer. CT
data was obtained and used for attenuation correction and anatomic
localization.
FASTING BLOOD GLUCOSE:  Value:  83 mg/dl

[Series 4: ct wb 5.0 b30f · axial · 5.0mm · 0.98mm/px · z∈[-1928,-122]mm · 4 of 603 slices shown]
[im 1/603  soft-tissue]
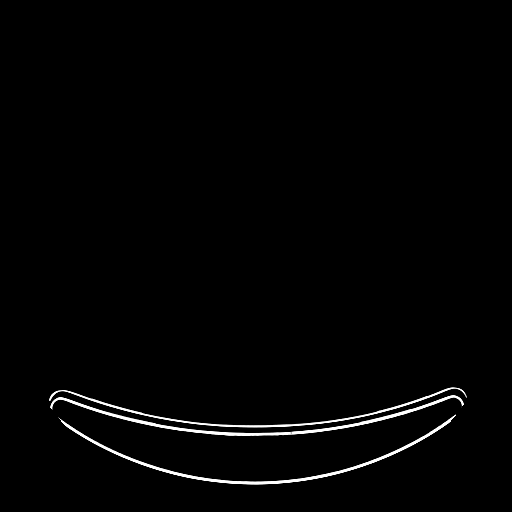
[im 201/603  soft-tissue]
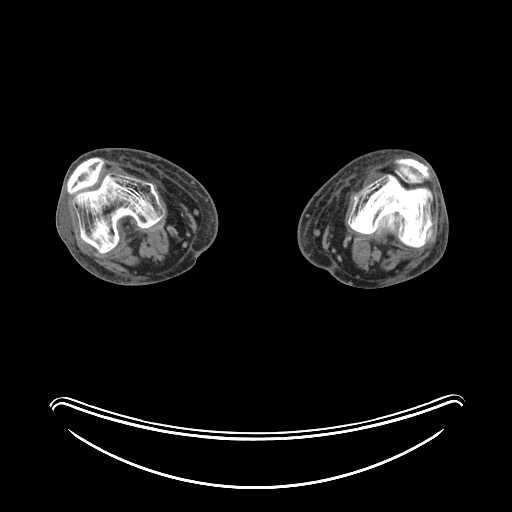
[im 402/603  soft-tissue]
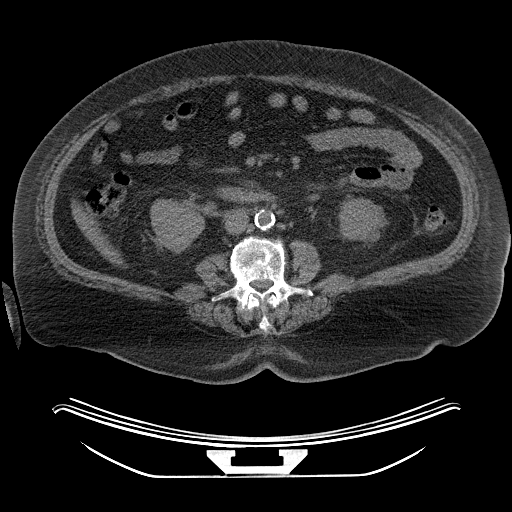
[im 603/603  soft-tissue]
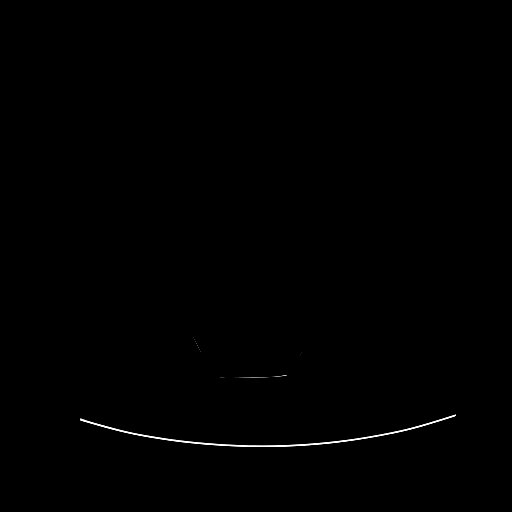

[Series 5: pet wb (ac) · axial · 5.0mm · 4.07mm/px · z∈[-1928,-122]mm · 4 of 603 slices shown]
[im 1/603]
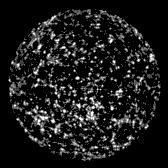
[im 201/603]
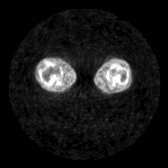
[im 402/603]
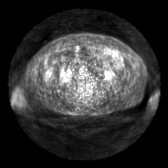
[im 603/603]
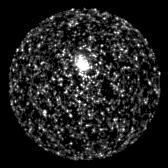

[Series 6: pet wb uncorrected (nac) · axial · 5.0mm · 4.07mm/px · z∈[-1928,-122]mm · 4 of 603 slices shown]
[im 1/603]
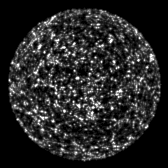
[im 201/603]
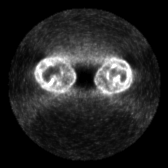
[im 402/603]
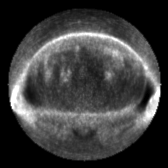
[im 603/603]
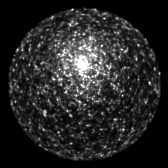

[Series 603: pet axial · 4 of 595 slices shown]
[im 1/595]
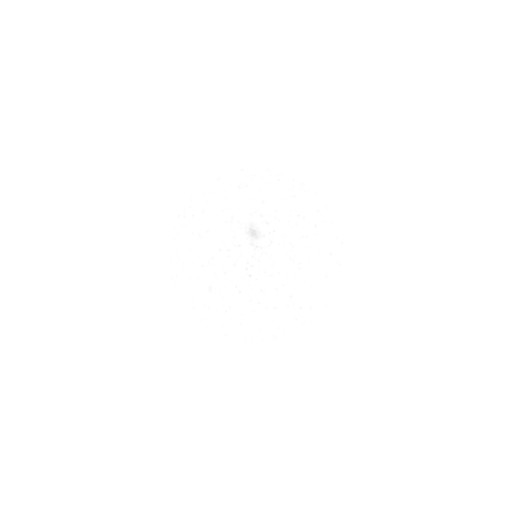
[im 199/595]
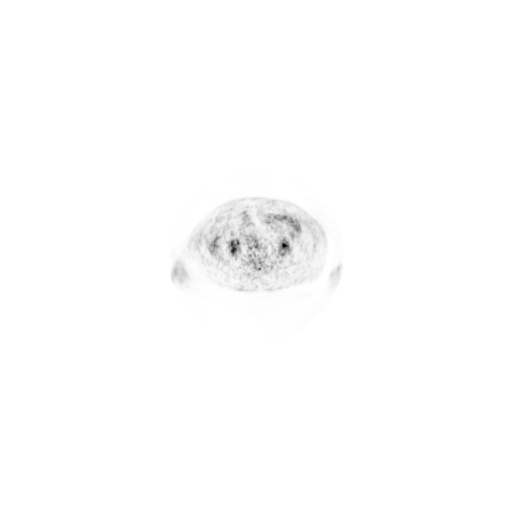
[im 397/595]
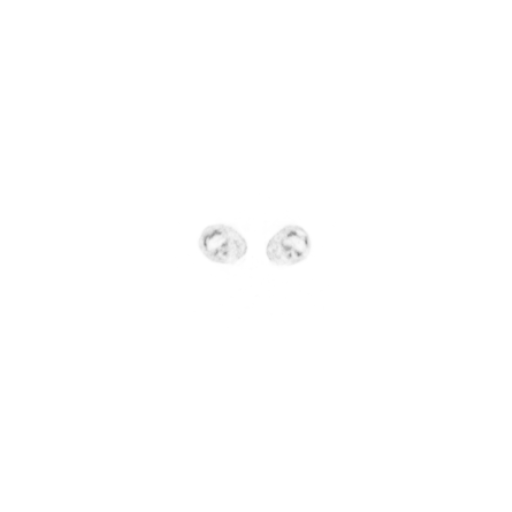
[im 595/595]
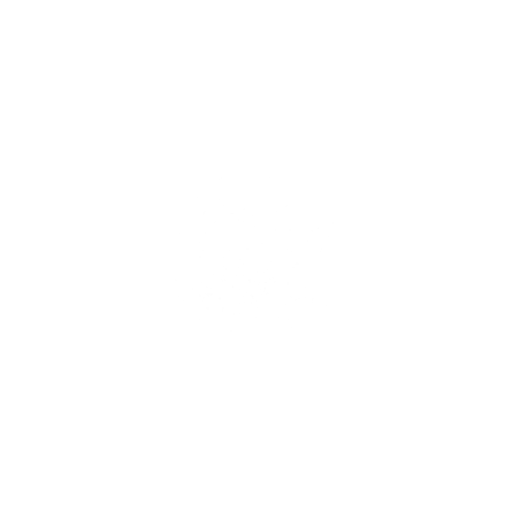

[Series 604: pet coronal · 1 of 116 slices shown]
[im 1/116]
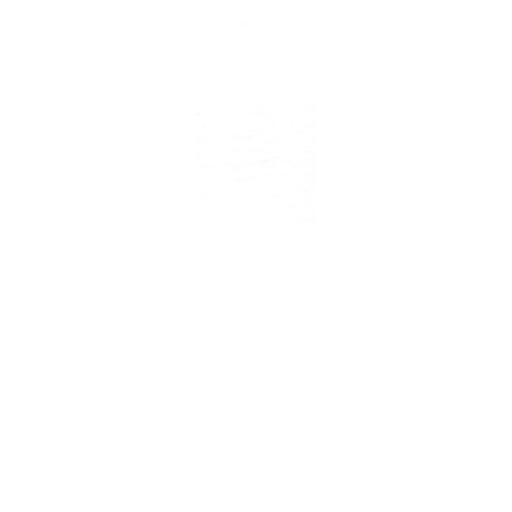

[Series 605: pet sagittal · 1 of 158 slices shown]
[im 1/158]
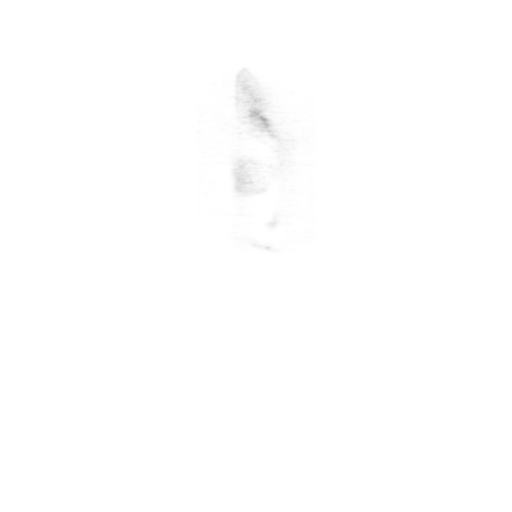

[Series 606: pet/ct axial · 4 of 592 slices shown]
[im 1/592]
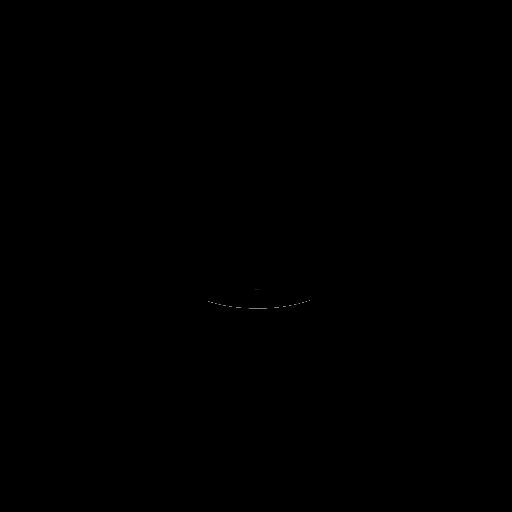
[im 198/592]
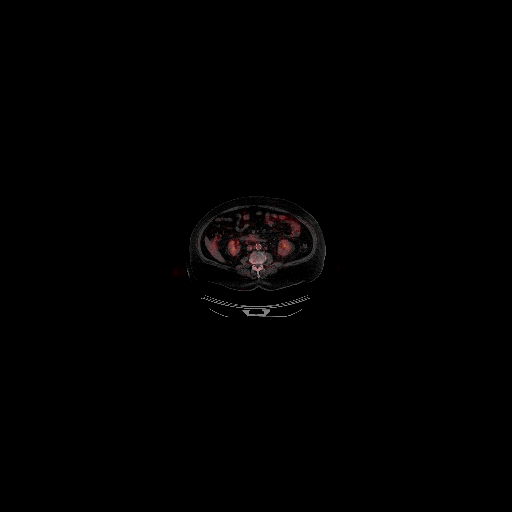
[im 395/592]
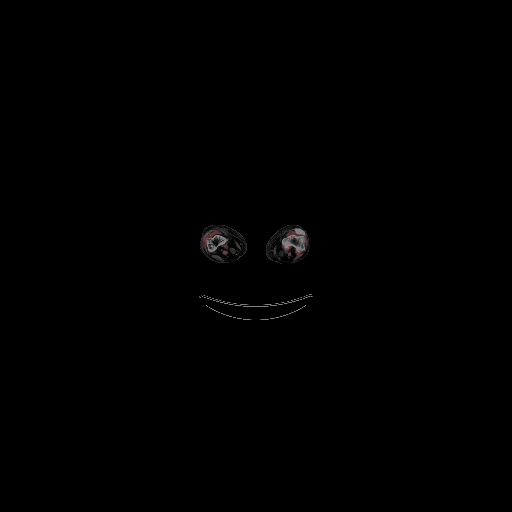
[im 592/592]
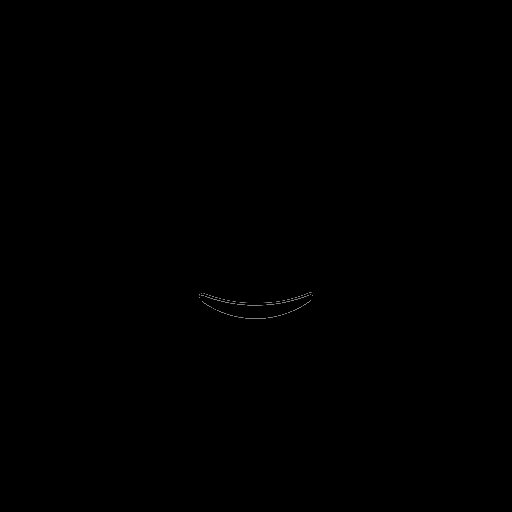

[Series 607: pet/ct coronal · 1 of 89 slices shown]
[im 1/89]
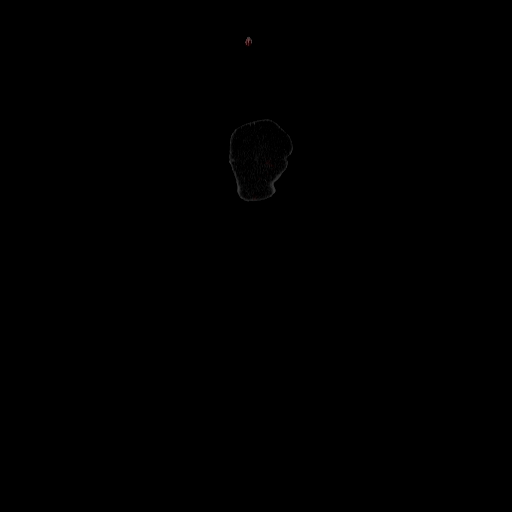

[Series 608: pet/ct sagittal · 1 of 151 slices shown]
[im 1/151]
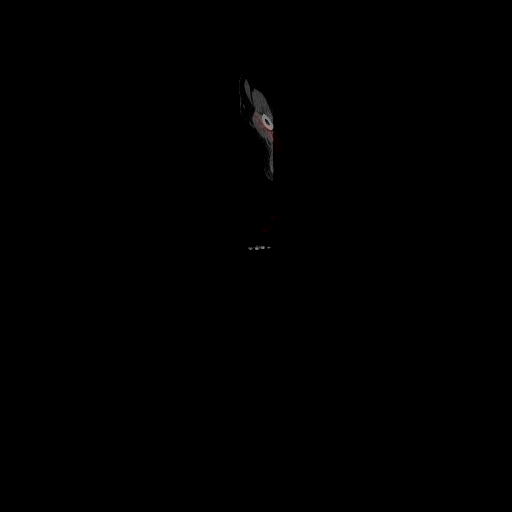

[results mm oncology reading · 0.98mm/px · 1 of 4 slices shown]
[im 1/4]
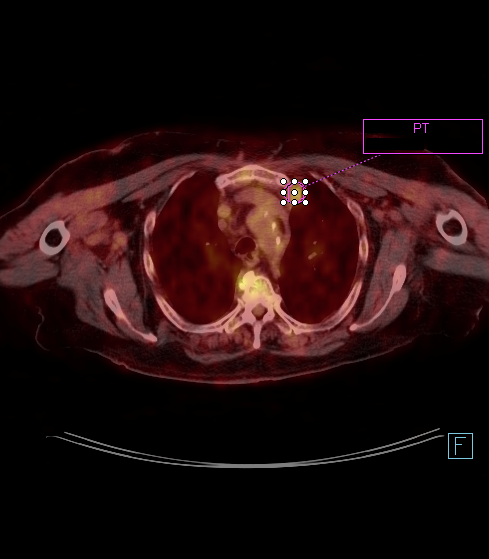

[25 of 25 positions shown; findings below may reference images not displayed]

FINDINGS: Head/Neck: No hypermetabolic lymph nodes in the neck.

Chest: [DATE] x 1.3 cm left upper lobe pulmonary nodule (series 4/
image 108), max SUV 1.8, previously 1.5 x 1.4 cm with max SUV 2.0.

New pulmonary nodules. No focal consolidation. Trace right pleural
effusion. No pneumothorax.

Cardiomegaly. No pericardial effusion. Coronary atherosclerosis.
Atherosclerotic calcifications of the aortic arch.

2.4 x 1.8 cm left superior mediastinal/prevascular node (series
4/image 106), max SUV 4.2, previously 2.8 x 2.0 cm with max SUV 3.4.

Additional small mediastinal lymph nodes, including a 11 mm right
paratracheal node and a 10 mm short axis subcarinal node, non FDG
avid.

Abdomen/Pelvis: No abnormal hypermetabolic activity within the
liver, pancreas, adrenal glands, or spleen.

2.9 cm right adrenal nodule, non FDG avid, compatible with a benign
adrenal adenoma. Cholecystectomy. Dystrophic calcifications in the
prostate

No hypermetabolic lymph nodes in the abdomen or pelvis.

Skeleton: Stable sclerotic/destructive changes involving the left L5
vertebral body (series 4/image 27), now with max SUV 4.2, which is
beneath the level of heterogeneous background elsewhere in the
thoracolumbar spine (up to max SUV 6.2).

Right hip arthroplasty.

Degenerative changes of the visualized thoracolumbar spine.

Extremities: No hypermetabolic activity to suggest metastasis.
IMPRESSION: 1.4 cm left upper lobe pulmonary metastasis, mildly decreased.

2.4 x 1.8 cm left superior mediastinal/prevascular node, stable
versus mildly decreased.

Osseous metastasis involving the left L5 vertebral body, no longer
FDG avid.

## 2015-12-16 IMAGING — CR DG CHEST 1V
1 series · 1 of 1 positions shown · non-contrast
Comparison: 12/24/2014

CLINICAL DATA: Pleural effusion

EXAM:
CHEST 1 VIEW

[portable]
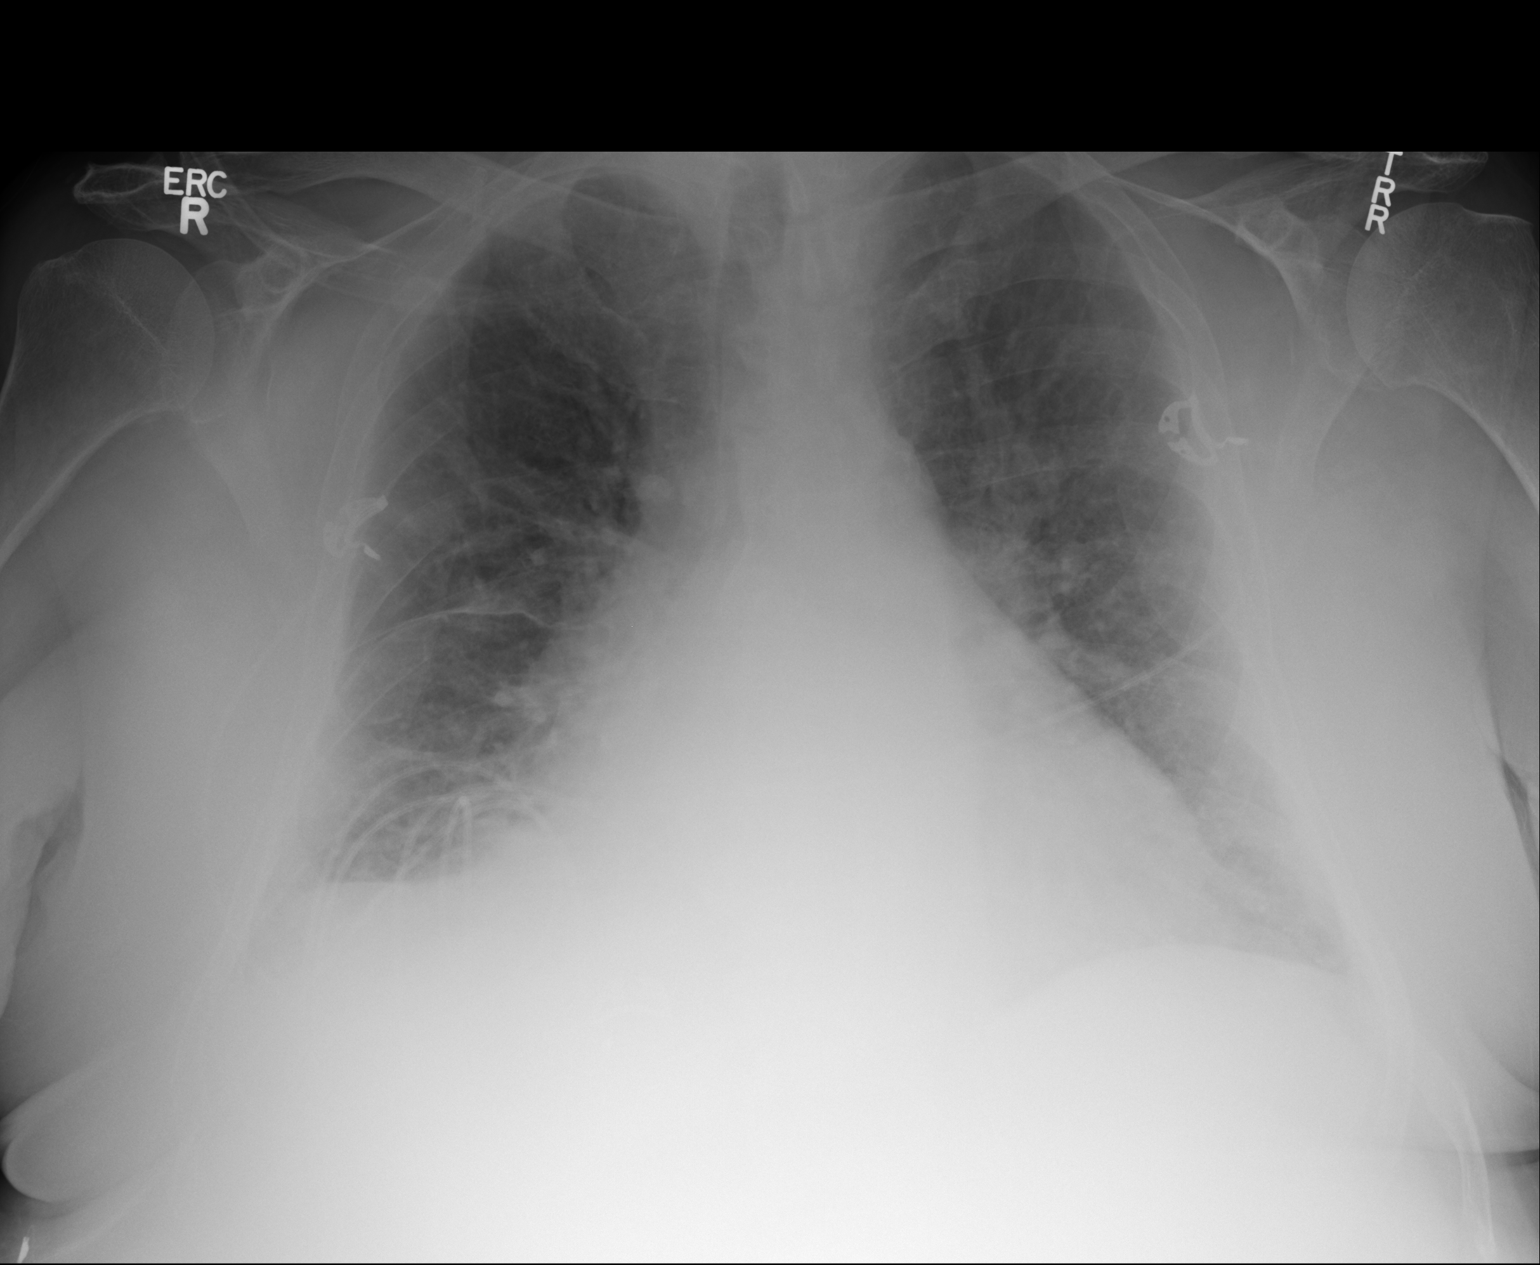

[1 of 1 positions shown; findings below may reference images not displayed]

FINDINGS: Stable enlarged cardiac silhouette. There is improvement in RIGHT
basilar atelectasis. Small bilateral effusions.
IMPRESSION: Improved RIGHT basilar atelectasis.

Small effusions and cardiomegaly.
